# Patient Record
Sex: Female | Born: 1985 | Race: White | Hispanic: No | Marital: Married | Smoking: Never smoker
Health system: Southern US, Community
[De-identification: ages and names within clinical notes are randomized; demographics above are authoritative.]

## PROBLEM LIST (undated history)

## (undated) DIAGNOSIS — I639 Cerebral infarction, unspecified: Secondary | ICD-10-CM

## (undated) DIAGNOSIS — O24419 Gestational diabetes mellitus in pregnancy, unspecified control: Secondary | ICD-10-CM

## (undated) DIAGNOSIS — O9902 Anemia complicating childbirth: Secondary | ICD-10-CM

## (undated) DIAGNOSIS — G43909 Migraine, unspecified, not intractable, without status migrainosus: Secondary | ICD-10-CM

## (undated) DIAGNOSIS — O139 Gestational [pregnancy-induced] hypertension without significant proteinuria, unspecified trimester: Secondary | ICD-10-CM

## (undated) HISTORY — DX: Anemia complicating childbirth: O99.02

## (undated) HISTORY — DX: Gestational diabetes mellitus in pregnancy, unspecified control: O24.419

## (undated) HISTORY — DX: Gestational (pregnancy-induced) hypertension without significant proteinuria, unspecified trimester: O13.9

## (undated) HISTORY — DX: Migraine, unspecified, not intractable, without status migrainosus: G43.909

## (undated) HISTORY — PX: CARDIAC SURGERY: SHX584

---
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
## 2011-11-07 ENCOUNTER — Telehealth: Payer: Self-pay

## 2011-11-07 NOTE — Telephone Encounter (Signed)
PT IN NEED OF HER XYANSE. PLEASE CALL [phone]

## 2011-11-08 MED ORDER — LISDEXAMFETAMINE DIMESYLATE 70 MG PO CAPS: 70.0000 mg | ORAL_CAPSULE | ORAL | Status: DC

## 2011-11-08 NOTE — Telephone Encounter (Signed)
LMOM THAT RX IS READY FOR PICKUP 

## 2011-11-08 NOTE — Telephone Encounter (Signed)
vyvanse ready to p/up

## 2011-12-10 ENCOUNTER — Telehealth: Payer: Self-pay

## 2011-12-10 NOTE — Telephone Encounter (Signed)
PT REQUESTING VYVANSE  REFILL     BEST PHONE [phone]

## 2011-12-11 MED ORDER — LISDEXAMFETAMINE DIMESYLATE 70 MG PO CAPS: 70.0000 mg | ORAL_CAPSULE | ORAL | Status: DC

## 2011-12-11 NOTE — Telephone Encounter (Signed)
Chart is at nurses station for review MR [id number]

## 2011-12-11 NOTE — Telephone Encounter (Signed)
LMOM THAT RX IS READY FOR PICKUP 

## 2011-12-11 NOTE — Telephone Encounter (Signed)
Rx ready for pick up. 

## 2012-03-16 ENCOUNTER — Ambulatory Visit (INDEPENDENT_AMBULATORY_CARE_PROVIDER_SITE_OTHER): Payer: BC Managed Care – PPO | Admitting: Emergency Medicine

## 2012-03-16 VITALS — BP 108/74 | HR 61 | Temp 98.3°F | Resp 17 | Ht 63.0 in | Wt 143.0 lb

## 2012-03-16 DIAGNOSIS — F988 Other specified behavioral and emotional disorders with onset usually occurring in childhood and adolescence: Secondary | ICD-10-CM

## 2012-03-16 MED ORDER — LISDEXAMFETAMINE DIMESYLATE 70 MG PO CAPS: 70.0000 mg | ORAL_CAPSULE | ORAL | Status: DC

## 2012-03-16 NOTE — Progress Notes (Signed)
   Date:  03/16/2012   Name:  KIYO HEAL   DOB:  [birth year]   MRN:  [id number] Gender: female Age: 26 y.o.  PCP:  No primary provider on file.    Chief Complaint: Medication Refill   History of Present Illness:  Rhonda Tran is a 26 y.o. pleasant patient who presents with the following:  Well established and documented history of ADD for renewal of prescription for vivance.  Weight stable, eating.  Symptoms controlled on stable dose of medication.  Not interfering with sleep.   No acute complaints.  There is no problem list on file for this patient.   No past medical history on file.  No past surgical history on file.  History  Substance Use Topics  . Smoking status: Never Smoker   . Smokeless tobacco: Not on file  . Alcohol Use: Not on file    No family history on file.  No Known Allergies  Medication list has been reviewed and updated.  Current Outpatient Prescriptions on File Prior to Visit  Medication Sig Dispense Refill  . lisdexamfetamine (VYVANSE) 70 MG capsule Take 1 capsule (70 mg total) by mouth every morning.  30 capsule  0    Review of Systems:  As per HPI, otherwise negative.    Physical Examination: Filed Vitals:   03/16/12 0937  BP: 108/74  Pulse: 61  Temp: 98.3 F (36.8 C)  Resp: 17   Filed Vitals:   03/16/12 0937  Height: 5\' 3"  (1.6 m)  Weight: 143 lb (64.864 kg)   Body mass index is 25.33 kg/(m^2). Ideal Body Weight: Weight in (lb) to have BMI = 25: 140.8    GEN: WDWN, NAD, Non-toxic, Alert & Oriented x 3 HEENT: Atraumatic, Normocephalic.  Ears and Nose: No external deformity. EXTR: No clubbing/cyanosis/edema NEURO: Normal gait.  PSYCH: Normally interactive. Conversant. Not depressed or anxious appearing.  Calm demeanor.    Assessment and Plan: ADD Continue medication Follow up with Dr Margarita Grizzle, Tessa Lerner, MD I have reviewed and agree with documentation. Robert P. Merla Riches, M.D.

## 2012-04-15 NOTE — Progress Notes (Signed)
Completed prior auth over the phone for pt's Vyvanse Rx and received approval through 04/15/13. Faxed approval notice to pharmacy.

## 2012-04-30 ENCOUNTER — Ambulatory Visit (INDEPENDENT_AMBULATORY_CARE_PROVIDER_SITE_OTHER): Payer: BC Managed Care – PPO | Admitting: Physician Assistant

## 2012-04-30 VITALS — BP 122/94 | HR 89 | Temp 98.1°F | Resp 16 | Ht 62.25 in | Wt 145.6 lb

## 2012-04-30 DIAGNOSIS — R21 Rash and other nonspecific skin eruption: Secondary | ICD-10-CM

## 2012-04-30 DIAGNOSIS — Z23 Encounter for immunization: Secondary | ICD-10-CM

## 2012-04-30 MED ORDER — DOXYCYCLINE HYCLATE 100 MG PO CAPS: 100.0000 mg | ORAL_CAPSULE | Freq: Two times a day (BID) | ORAL | Status: DC

## 2012-04-30 NOTE — Progress Notes (Signed)
  Subjective:    Patient ID: Rhonda Tran, female    DOB: 04/13/1986, 26 y.o.   MRN: [id number]  HPI 26 year old female presents with acute onset of a rash on her pubic region.  States symptoms started 2 days ago as a small pimple that she tried to pop and did get a little bit of pus out. Since then they have become more painful. She has been putting Neosporin on the lesions which does not seem to be helping at all. Today they were irritating while she was sitting and so she decided to get it checked out.  Denies vaginal discharge, abdominal pain, nausea, vomiting, fever, or chills.  Admits that she has not been sexually active in 2 years, and had normal STD testing this summer.  Says this included a blood test for herpes - all negative.  She has no specific concerns for sexually transmitted infection.     Review of Systems  Constitutional: Negative for fever and chills.  Gastrointestinal: Negative for nausea, vomiting and abdominal pain.  Skin: Positive for rash.  All other systems reviewed and are negative.       Objective:   Physical Exam  Constitutional: She is oriented to person, place, and time. She appears well-developed and well-nourished.  HENT:  Head: Normocephalic and atraumatic.  Right Ear: External ear normal.  Left Ear: External ear normal.  Eyes: Conjunctivae normal are normal.  Neck: Normal range of motion.  Cardiovascular: Normal rate, regular rhythm and normal heart sounds.   Pulmonary/Chest: Effort normal and breath sounds normal.  Musculoskeletal: Normal range of motion.  Neurological: She is alert and oriented to person, place, and time.  Skin: Rash noted.     Psychiatric: She has a normal mood and affect. Her behavior is normal. Judgment and thought content normal.          Assessment & Plan:   1. Rash and nonspecific skin eruption  Herpes simplex virus culture, HSV(herpes simplex vrs) 1+2 ab-IgG, Wound culture  2. Need for influenza vaccination  Flu  vaccine greater than or equal to 3yo preservative free IM   Discussed folliculitis vs herpes infection.  I do think the diagnosis of herpes is unlikely but still need to rule out. Will await culture's.  Start doxycycline 100 mg bid.  Warm compresses.  D/C Neosporin.

## 2012-05-02 LAB — HERPES SIMPLEX VIRUS CULTURE: Organism ID, Bacteria: NOT DETECTED

## 2012-05-02 LAB — HSV(HERPES SIMPLEX VRS) I + II AB-IGG
HSV 1 Glycoprotein G Ab, IgG: 0.47 IV
HSV 2 Glycoprotein G Ab, IgG: <0.1 IV

## 2012-05-03 LAB — WOUND CULTURE
Gram Stain: NONE SEEN
Gram Stain: NONE SEEN

## 2012-06-12 ENCOUNTER — Telehealth: Payer: Self-pay

## 2012-06-12 NOTE — Telephone Encounter (Signed)
Pt would like to get her prescription for Vyvanse.  Please call when ready to pick up.  [phone]

## 2012-06-13 NOTE — Telephone Encounter (Signed)
No, she must come in to be seen, with the person that originally prescribed her vyvanse.

## 2012-06-14 ENCOUNTER — Encounter: Payer: Self-pay | Admitting: Internal Medicine

## 2012-06-14 ENCOUNTER — Ambulatory Visit (INDEPENDENT_AMBULATORY_CARE_PROVIDER_SITE_OTHER): Payer: BC Managed Care – PPO | Admitting: Internal Medicine

## 2012-06-14 ENCOUNTER — Telehealth: Payer: Self-pay

## 2012-06-14 VITALS — BP 112/74 | HR 72 | Temp 98.6°F | Resp 18 | Ht 63.5 in | Wt 149.0 lb

## 2012-06-14 DIAGNOSIS — Z9861 Coronary angioplasty status: Secondary | ICD-10-CM

## 2012-06-14 DIAGNOSIS — Z8774 Personal history of (corrected) congenital malformations of heart and circulatory system: Secondary | ICD-10-CM | POA: Insufficient documentation

## 2012-06-14 DIAGNOSIS — G43909 Migraine, unspecified, not intractable, without status migrainosus: Secondary | ICD-10-CM | POA: Insufficient documentation

## 2012-06-14 DIAGNOSIS — F988 Other specified behavioral and emotional disorders with onset usually occurring in childhood and adolescence: Secondary | ICD-10-CM | POA: Insufficient documentation

## 2012-06-14 HISTORY — DX: Migraine, unspecified, not intractable, without status migrainosus: G43.909

## 2012-06-14 MED ORDER — LISDEXAMFETAMINE DIMESYLATE 70 MG PO CAPS: 70.0000 mg | ORAL_CAPSULE | ORAL | Status: DC

## 2012-06-14 NOTE — Progress Notes (Signed)
  Subjective:    Patient ID: Rhonda Tran, female    DOB: Dec 05, 1985, 26 y.o.   MRN: [id number]  HPIhere for f/u Patient Active Problem List  Diagnosis  . 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 ADD (attention deficit disorder)-continues to do well on 70 mg vyvanse/no side effects  . Migraine headache-these headaches are mainly the first few days after each menses. They respond to ibuprofen.    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 . is doing very well Plans to get married in February/remains abstinent/to discuss contraception with OB at the upcoming appointment Nonsmoker Immunizations still up to date   Review of Systems No fatigue night sweats or weight loss No vision changes No tremor No palpitations chest pain GI and GU are negative    Objective:   Physical Exam Vital signs normal HEENT clear No thyromegaly or lymphadenopathy Heart regular without murmurs rubs clicks or gallops/S1 and S2 are normal Neurological intact Psychological intact       Assessment & Plan:   1. ADD (attention deficit disorder)  lisdexamfetamine (VYVANSE) 70 MG capsule, lisdexamfetamine (VYVANSE) 70 MG capsule,07/14/12 lisdexamfetamine (VYVANSE) 70 MG capsule 08/15/12   2. History of percutaneous transcatheter closure of congenital ASD   should need no further cardiology evaluation unless she develops  An atrial arrhythmia  3. Migraine headache   stable    She may call for another three-month prescription of medication for ADD and has a followup appointment at 104 in 6 months

## 2012-06-14 NOTE — Telephone Encounter (Signed)
Pt and patients mother called to see why patient has to come back in -which they state she has never had to do before and that she has an appt with dr Merla Rhonda Tran on 07/16/12 and she is out of her medication  vyvanse  Best number is [phone] mothers number

## 2012-06-14 NOTE — Telephone Encounter (Signed)
Patient is currently in the office being seen by Dr. Merla Riches. Will close this phone note.

## 2012-06-16 NOTE — Telephone Encounter (Signed)
Dr Merla Riches has seen her on Friday, and this is taken care of.

## 2012-06-18 NOTE — Progress Notes (Signed)
Cancelled 07/2012 appt and left msg for pt to schedule 6 month f/u with Dr. Merla Riches. Rhonda Tran

## 2012-06-20 NOTE — Progress Notes (Signed)
Six month appt made with Dr. Merla Riches for 12/11/11.

## 2012-07-15 NOTE — Progress Notes (Signed)
Called for prior auth on pt's vyvanse 70 and was told that it just needed to be transferred from another acct. PA was transferred and medication is approved through 04/15/2013. Faxed approval notice to pharmacy.

## 2012-07-16 ENCOUNTER — Ambulatory Visit: Payer: BC Managed Care – PPO | Admitting: Internal Medicine

## 2012-09-10 ENCOUNTER — Telehealth: Payer: Self-pay

## 2012-09-10 DIAGNOSIS — F988 Other specified behavioral and emotional disorders with onset usually occurring in childhood and adolescence: Secondary | ICD-10-CM

## 2012-09-10 NOTE — Telephone Encounter (Signed)
Pt requesting vyvanse refill,would like to be written for 90 day supply,she states if we forward to dr Merla Riches he will approve that amount  Best phone [phone]

## 2012-09-13 ENCOUNTER — Telehealth: Payer: Self-pay

## 2012-09-13 MED ORDER — LISDEXAMFETAMINE DIMESYLATE 70 MG PO CAPS: 70.0000 mg | ORAL_CAPSULE | ORAL | Status: DC

## 2012-09-13 NOTE — Telephone Encounter (Signed)
Please advise on Vyvanse renewal

## 2012-09-13 NOTE — Telephone Encounter (Signed)
Patient advised; Rx at front desk

## 2012-09-13 NOTE — Telephone Encounter (Signed)
Meds ordered this encounter  Medications  . lisdexamfetamine (VYVANSE) 70 MG capsule    Sig: Take 1 capsule (70 mg total) by mouth every morning. For after 11/13/12    Dispense:  30 capsule    Refill:  0  . lisdexamfetamine (VYVANSE) 70 MG capsule    Sig: Take 1 capsule (70 mg total) by mouth every morning. For after 10/14/12    Dispense:  30 capsule    Refill:  0  . lisdexamfetamine (VYVANSE) 70 MG capsule    Sig: Take 1 capsule (70 mg total) by mouth every morning.    Dispense:  30 capsule    Refill:  0

## 2012-09-13 NOTE — Telephone Encounter (Signed)
Patient states she called on Wednesday about her rx refill. She has not heard anything from [place] yet and she will be out of her rx tomorrow. Please call back at [phone]. Sees Dr. Merla Riches.

## 2012-09-15 NOTE — Telephone Encounter (Signed)
Yes, I called her on Saturday and she picked this up.

## 2012-09-15 NOTE — Telephone Encounter (Signed)
Did this get done

## 2012-10-23 ENCOUNTER — Encounter: Payer: Self-pay | Admitting: Cardiology

## 2012-10-23 NOTE — Progress Notes (Signed)
Patient ID: Rhonda Tran, female   DOB: Oct 17, 1985, 27 y.o.   MRN: [id number]   Rhonda, Tran    Date of visit:  10/23/2012 DOB:  [birth year]    Age:  26 yrs. Medical record number:  [id number]     Account number:  [id number] Primary Care Provider: Ellamae Sia Tran ____________________________ CURRENT DIAGNOSES  1. History of PFO closure with Amplatzer device  2. TIA ____________________________ ALLERGIES  NKDA ____________________________ MEDICATIONS  1. Vyvanse 70 mg capsule, 1 Tran.o. daily  2. Loestrin 24 Fe 1-20 (24)-75(4) mg-mcg-mg tablet, 1 Tran.o. daily  3. minocycline 100 mg tablet, BID ____________________________ CHIEF COMPLAINTS  Established with cardiologist ____________________________ HISTORY OF PRESENT ILLNESS  This very nice 27 year old female is seen for cardiac evaluation. She has previously been in good health but at age 59 had what was felt to be a left-sided TIA. She was eventually told that she had a PFO and had a positive Doppler study and bubble study. She was evaluated by Dr. Nadara Eaton in town and eventually a neurologist and was told that she had migraines but also had a positive intracranial Doppler. She eventually went to see Dr. Venetia Maxon in [place] and underwent closure of a PFO with a 60 mm Amplatzer device. She stated that she had a number of migraines in the early period after this but had done well and was seen in followuplast in 2008. She recently received a letter advising her to have followup because of FDA requirements and made an appointment to see me today. She is due to be married in June. She denies angina and has no exercise intolerance. She has felt fine since her PFO was closed. She has no arrhythmias and denies PND, orthopnea, syncope, or claudication. ____________________________ PAST HISTORY  Past Medical Illnesses:  history of TIA, migraine headaches, ADHD;  Cardiovascular Illnesses:  ASD;  Cardiology Procedures-Invasive:  closure of PFO with 60 mm  Amplatzer device, [facility] , [place]  06/2005;  Cardiology Procedures-Noninvasive:  echocardiogram January 2008 2007 2006;  LVEF of 72% documented via echocardiogram on 08/13/2006,   ____________________________ CARDIO-PULMONARY TEST DATES EKG Date:  10/23/2012;   ____________________________ FAMILY HISTORY Father - age 26,  alive and well; Mother - age 107,  alive and well;  ____________________________ SOCIAL HISTORY Alcohol Use:  no alcohol use;  Smoking:  never smoked;  Diet:  regular diet;  Lifestyle:  single;  Exercise:  curves 3 x week;  Occupation:  Runner, broadcasting/film/video;  Residence:  lives with mother;   ____________________________ REVIEW OF SYSTEMS General:  denies recent weight change, fatique or change in exercise tolerance. Eyes: denies diplopia, history of glaucoma or visual problems. Ears, Nose, Throat, Mouth:  denies any hearing loss, epistaxis, hoarseness or difficulty speaking. Respiratory: denies dyspnea, cough, wheezing or hemoptysis. Cardiovascular:  please review HPI Abdominal: denies dyspepsia, GI bleeding, constipation, or diarrhea Genitourinary-Female: no dysuria, urgency, frequency, or nocturia Genitourinary-Female: no dysuria, urgency, frequency, UTIs, or stress incontinence Musculoskeletal:  denies arthritis, venous insufficiency, or muscle weakness. Neurological:  occasional headaches  ____________________________ PHYSICAL EXAMINATION VITAL SIGNS  Blood Pressure:  104/70 Sitting, Right arm, regular cuff  , 100/68 Standing, Right arm and regular cuff   Pulse:  82/min. Weight:  142.00 lbs. Height:  63"BMI: 25  Constitutional:  cooperative, alert and oriented,well developed, well nourished, in no acute distress. Skin:  blotchy rash Head:  normocephalic, normal hair pattern, no masses or tenderness Eyes:  EOMS Intact, PERRLA, C and S clear, Funduscopic exam not done. ENT:  ears, nose and throat  reveal no gross abnormalities.  Dentition good. Neck:  supple, without massess. No JVD,  thyromegaly or carotid bruits. Carotid upstroke normal. Chest:  normal symmetry, clear to auscultation and percussion. Cardiac:  regular rhythm, normal S1 and S2, No S3 or S4, no murmurs, gallops or rubs detected. Abdomen:  abdomen soft,non-tender, no masses, no hepatospenomegaly, or aneurysm noted Peripheral Pulses:  the femoral,dorsalis pedis, and posterior tibial pulses are full and equal bilaterally with no bruits auscultated. Extremities & Back:  no deformities, clubbing, cyanosis, erythema or edema observed. Normal muscle strength and tone. Neurological:  no gross motor or sensory deficits noted, affect appropriate, oriented x3. ____________________________ IMPRESSIONS/PLAN  1. History of TIA 2. History of closure of PFO with 60 mm Amplatzer device in 2006  Recommendations:  Obtain followup echocardiogram and have yearly visit here with echo. EKG is normal.  ____________________________ TODAYS ORDERS  1. 2D, color flow, doppler: At [facility]  2. 12 Lead EKG: Today                      ____________________________ Cardiology Physician:  Darden Palmer MD [facility]

## 2012-12-10 ENCOUNTER — Ambulatory Visit (INDEPENDENT_AMBULATORY_CARE_PROVIDER_SITE_OTHER): Payer: BC Managed Care – PPO | Admitting: Internal Medicine

## 2012-12-10 ENCOUNTER — Encounter: Payer: Self-pay | Admitting: Internal Medicine

## 2012-12-10 VITALS — BP 115/76 | HR 104 | Temp 98.7°F | Resp 18 | Ht 63.0 in | Wt 143.0 lb

## 2012-12-10 DIAGNOSIS — F988 Other specified behavioral and emotional disorders with onset usually occurring in childhood and adolescence: Secondary | ICD-10-CM

## 2012-12-10 MED ORDER — LISDEXAMFETAMINE DIMESYLATE 70 MG PO CAPS: 70.0000 mg | ORAL_CAPSULE | ORAL | Status: DC

## 2012-12-10 NOTE — Progress Notes (Addendum)
  Subjective:    Patient ID: Rhonda Tran, female    DOB: 1986/05/24, 27 y.o.   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 Donnie Aho will see her yearly for 5 years  Objective:   Physical Exam BP 115/76  Pulse 104  Temp(Src) 98.7 F (37.1 C)  Resp 18  Ht 5\' 3"  (1.6 m)  Wt 143 lb (64.864 kg)  BMI 25.34 kg/m2 Exam intact       Assessment & Plan:  Attention deficit disorder  Meds ordered this encounter  Medications  . lisdexamfetamine (VYVANSE) 70 MG capsule    Sig: Take 1 capsule (70 mg total) by mouth every morning. 02/13/13    Dispense:  30 capsule    Refill:  0  . lisdexamfetamine (VYVANSE) 70 MG capsule    Sig: Take 1 capsule (70 mg total) by mouth every morning. For after 12/14/12    Dispense:  30 capsule    Refill:  0  . lisdexamfetamine (VYVANSE) 70 MG capsule    Sig: Take 1 capsule (70 mg total) by mouth every morning. For after 01/13/13    Dispense:  30 capsule    Refill:  0  Refill in 3 months in followup in 6 months

## 2013-02-17 ENCOUNTER — Encounter: Payer: Self-pay | Admitting: Internal Medicine

## 2013-02-25 ENCOUNTER — Ambulatory Visit (INDEPENDENT_AMBULATORY_CARE_PROVIDER_SITE_OTHER): Payer: BC Managed Care – PPO | Admitting: Physician Assistant

## 2013-02-25 VITALS — BP 102/66 | HR 89 | Temp 98.8°F | Resp 18 | Ht 63.0 in | Wt 147.0 lb

## 2013-02-25 DIAGNOSIS — M549 Dorsalgia, unspecified: Secondary | ICD-10-CM

## 2013-02-25 MED ORDER — CYCLOBENZAPRINE HCL 10 MG PO TABS: 10.0000 mg | ORAL_TABLET | Freq: Three times a day (TID) | ORAL | Status: DC | PRN

## 2013-02-25 MED ORDER — MELOXICAM 15 MG PO TABS: 15.0000 mg | ORAL_TABLET | Freq: Every day | ORAL | Status: DC

## 2013-02-25 NOTE — Progress Notes (Signed)
  Subjective:    Patient ID: Rhonda Tran, female    DOB: 1986-06-30, 27 y.o.   MRN: [id number]  HPI   Ms. Rhonda Tran is a very pleasant 27 yr old female here with 2 days of mid/low back pain.  Slipped and fell Monday night, think she hit her back.  Noticed pain the next morning, worsening since then.  "Whole back hurts" - esp center of low back.  No previous back problems.  No radiation of pain.  No numbness or tingling.  No loss of bowel or bladder control.  Rates pain as 7/10, did take 2 ibuprofen today.  Pain is not as bad in the AM, but worsens throughout the day.     Review of Systems  Constitutional: Negative.   HENT: Negative.   Respiratory: Negative.   Cardiovascular: Negative.   Gastrointestinal: Negative.   Musculoskeletal: Positive for back pain.  Skin: Negative.   Neurological: Negative.        Objective:   Physical Exam  Vitals reviewed. Constitutional: She is oriented to person, place, and time. She appears well-developed and well-nourished. No distress.  HENT:  Head: Normocephalic and atraumatic.  Eyes: Conjunctivae are normal. No scleral icterus.  Cardiovascular: Normal rate, regular rhythm and normal heart sounds.   Pulmonary/Chest: Effort normal and breath sounds normal.  Abdominal: Soft. There is no tenderness.  Musculoskeletal:       Cervical back: Normal.       Thoracic back: She exhibits pain. She exhibits normal range of motion, no tenderness, no bony tenderness and no spasm.       Lumbar back: She exhibits pain. She exhibits normal range of motion, no tenderness, no bony tenderness and no spasm.  Neurological: She is alert and oriented to person, place, and time. She has normal reflexes.  Skin: Skin is warm and dry.  Psychiatric: She has a normal mood and affect. Her behavior is normal.         Assessment & Plan:  Back pain - Plan: cyclobenzaprine (FLEXERIL) 10 MG tablet, meloxicam (MOBIC) 15 MG tablet   Ms. Rhonda Tran is a very pleasant 27 yr old female here  with 2 days of back pain after a fall.  Exam is reassuring - there is no bony tenderness and no TTP or palpable spasm.  Her ROM is normal though she has pain at the ends of her range.  Will try Mobic and Flexeril.  Encouraged gentle stretching and ROM exercises.  Discouraged her from remaining sedentary.  Edu materials given.  If pain worsening or not improving pt to RTC.

## 2013-02-25 NOTE — Patient Instructions (Addendum)
Begin taking the meloxicam (Mobic) once daily - this works on pain and inflammation.  Do not take any additional ibuprofen or aleve with this medication.  You can take tylenol if you need further pain relief.  Also begin using the cyclobenzaprine (Flexeril) every 8 hours as needed - this may make you sleepy so be careful with the first dose.  You can also cut this medication in half.  Continue these medications for 1-2 weeks.  Be careful with lifting and bending for the next 2-3 days, but do not remain completely sedentary - movement is medicine!  If your pain is worsening or persisting please come back in to be rechecked   Back Pain, Adult Low back pain is very common. About 1 in 5 people have back pain.The cause of low back pain is rarely dangerous. The pain often gets better over time.About half of people with a sudden onset of back pain feel better in just 2 weeks. About 8 in 10 people feel better by 6 weeks.  CAUSES Some common causes of back pain include:  Strain of the muscles or ligaments supporting the spine.  Wear and tear (degeneration) of the spinal discs.  Arthritis.  Direct injury to the back. DIAGNOSIS Most of the time, the direct cause of low back pain is not known.However, back pain can be treated effectively even when the exact cause of the pain is unknown.Answering your caregiver's questions about your overall health and symptoms is one of the most accurate ways to make sure the cause of your pain is not dangerous. If your caregiver needs more information, he or she may order lab work or imaging tests (X-rays or MRIs).However, even if imaging tests show changes in your back, this usually does not require surgery. HOME CARE INSTRUCTIONS For many people, back pain returns.Since low back pain is rarely dangerous, it is often a condition that people can learn to [facility] their own.   Remain active. It is stressful on the back to sit or stand in one place. Do not sit, drive,  or stand in one place for more than 30 minutes at a time. Take short walks on level surfaces as soon as pain allows.Try to increase the length of time you walk each day.  Do not stay in bed.Resting more than 1 or 2 days can delay your recovery.  Do not avoid exercise or work.Your body is made to move.It is not dangerous to be active, even though your back may hurt.Your back will likely heal faster if you return to being active before your pain is gone.  Pay attention to your body when you bend and lift. Many people have less discomfortwhen lifting if they bend their knees, keep the load close to their bodies,and avoid twisting. Often, the most comfortable positions are those that put less stress on your recovering back.  Find a comfortable position to sleep. Use a firm mattress and lie on your side with your knees slightly bent. If you lie on your back, put a pillow under your knees.  Only take over-the-counter or prescription medicines as directed by your caregiver. Over-the-counter medicines to reduce pain and inflammation are often the most helpful.Your caregiver may prescribe muscle relaxant drugs.These medicines help dull your pain so you can more quickly return to your normal activities and healthy exercise.  Put ice on the injured area.  Put ice in a plastic bag.  Place a towel between your skin and the bag.  Leave the ice on for  15-20 minutes, 3-4 times a day for the first 2 to 3 days. After that, ice and heat may be alternated to reduce pain and spasms.  Ask your caregiver about trying back exercises and gentle massage. This may be of some benefit.  Avoid feeling anxious or stressed.Stress increases muscle tension and can worsen back pain.It is important to recognize when you are anxious or stressed and learn ways to manage it.Exercise is a great option. SEEK MEDICAL CARE IF:  You have pain that is not relieved with rest or medicine.  You have pain that does not  improve in 1 week.  You have new symptoms.  You are generally not feeling well. SEEK IMMEDIATE MEDICAL CARE IF:   You have pain that radiates from your back into your legs.  You develop new bowel or bladder control problems.  You have unusual weakness or numbness in your arms or legs.  You develop nausea or vomiting.  You develop abdominal pain.  You feel faint. Document Released: 06/25/2005 Document Revised: 12/25/2011 Document Reviewed: 11/13/2010 [facility] Patient Information 2014 [place], [place].

## 2013-03-09 ENCOUNTER — Telehealth: Payer: Self-pay

## 2013-03-09 DIAGNOSIS — F988 Other specified behavioral and emotional disorders with onset usually occurring in childhood and adolescence: Secondary | ICD-10-CM

## 2013-03-09 MED ORDER — LISDEXAMFETAMINE DIMESYLATE 70 MG PO CAPS: 70.0000 mg | ORAL_CAPSULE | ORAL | Status: DC

## 2013-03-09 NOTE — Telephone Encounter (Signed)
Meds ordered this encounter  Medications  . lisdexamfetamine (VYVANSE) 70 MG capsule    Sig: Take 1 capsule (70 mg total) by mouth every morning.    Dispense:  30 capsule    Refill:  0    Fill on or after 03/16/13  . lisdexamfetamine (VYVANSE) 70 MG capsule    Sig: Take 1 capsule (70 mg total) by mouth every morning.    Dispense:  30 capsule    Refill:  0    For on or after 04/15/13  . lisdexamfetamine (VYVANSE) 70 MG capsule    Sig: Take 1 capsule (70 mg total) by mouth every morning.    Dispense:  30 capsule    Refill:  0    For on or after 05/16/13

## 2013-03-09 NOTE — Telephone Encounter (Signed)
Patient called requesting Dr. Merla Riches to prescribe more Vyzanse. Patient stated that her refills are out. Patient's phone # [phone]. Patient has requested to be called back.

## 2013-03-09 NOTE — Telephone Encounter (Signed)
Pended please advise.  

## 2013-03-09 NOTE — Telephone Encounter (Signed)
called her to advise

## 2013-03-10 ENCOUNTER — Telehealth: Payer: Self-pay

## 2013-03-10 DIAGNOSIS — F988 Other specified behavioral and emotional disorders with onset usually occurring in childhood and adolescence: Secondary | ICD-10-CM

## 2013-03-10 NOTE — Telephone Encounter (Signed)
PATIENT STATES DR. Merla Riches DID NOT GIVE HER ENOUGH VYVANSE PILLS FOR THE MONTH OF AUGUST. SHE WAS 2 PILLS SHORT SINCE THERE ARE 31 DAYS IN THE MONTH. PLEASE CALL HER WHEN THE PRESCRIPTION CAN BE PICKED UP. BEST PHONE [phone] (CELL)  MBC

## 2013-03-10 NOTE — Telephone Encounter (Signed)
Please advise 

## 2013-03-11 NOTE — Telephone Encounter (Signed)
Has rx for 9/8, 10/8,11/8 what do we need to do ?

## 2013-03-12 ENCOUNTER — Other Ambulatory Visit: Payer: Self-pay | Admitting: Radiology

## 2013-03-12 ENCOUNTER — Telehealth: Payer: Self-pay

## 2013-03-12 DIAGNOSIS — F988 Other specified behavioral and emotional disorders with onset usually occurring in childhood and adolescence: Secondary | ICD-10-CM

## 2013-03-12 MED ORDER — LISDEXAMFETAMINE DIMESYLATE 70 MG PO CAPS: 70.0000 mg | ORAL_CAPSULE | ORAL | Status: DC

## 2013-03-12 NOTE — Telephone Encounter (Signed)
Patient's mother is calling to check the status of daughter's RX. States that daughter is going to run out of Vyvanse on Saturday 03/14/2013. Wants to know if she can get another supply before the 8th. [phone]

## 2013-03-12 NOTE — Telephone Encounter (Signed)
Sure --can fill early or can write new rx what works best??? I'll just write new rx now and we can leave fore her

## 2013-03-12 NOTE — Telephone Encounter (Signed)
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

## 2013-03-12 NOTE — Telephone Encounter (Signed)
Please advise. Do you want to write for 2 pills? Pended, this is what she wants.

## 2013-03-12 NOTE — Telephone Encounter (Signed)
She had her mother call me , she will come in and exchange the rx

## 2013-03-12 NOTE — Telephone Encounter (Signed)
She will have Baxter Hire come in and exchange the Rx

## 2013-03-12 NOTE — Telephone Encounter (Signed)
I have placed the other Rx's in your box. She is concerned about the one for October as well. Wants #31 for this, pended

## 2013-03-12 NOTE — Telephone Encounter (Signed)
Pended please advise.  

## 2013-03-12 NOTE — Telephone Encounter (Signed)
Called about this, left message for her to call me back. She will need to exchange the old rx for the new one.

## 2013-03-13 ENCOUNTER — Other Ambulatory Visit: Payer: Self-pay | Admitting: Radiology

## 2013-03-13 DIAGNOSIS — F988 Other specified behavioral and emotional disorders with onset usually occurring in childhood and adolescence: Secondary | ICD-10-CM

## 2013-03-13 MED ORDER — LISDEXAMFETAMINE DIMESYLATE 70 MG PO CAPS: 70.0000 mg | ORAL_CAPSULE | ORAL | Status: DC

## 2013-03-13 NOTE — Telephone Encounter (Signed)
Reprinted for quantity of #31 old rx's for OCT and NOV shredded.

## 2013-04-15 NOTE — Progress Notes (Signed)
PA approved for vyvanse 70 mg through 04/15/14. Pt notified.

## 2013-06-10 ENCOUNTER — Ambulatory Visit (INDEPENDENT_AMBULATORY_CARE_PROVIDER_SITE_OTHER): Payer: BC Managed Care – PPO | Admitting: Internal Medicine

## 2013-06-10 ENCOUNTER — Encounter: Payer: Self-pay | Admitting: Internal Medicine

## 2013-06-10 VITALS — BP 98/78 | HR 88 | Temp 98.8°F | Resp 16 | Ht 62.5 in | Wt 152.2 lb

## 2013-06-10 DIAGNOSIS — F988 Other specified behavioral and emotional disorders with onset usually occurring in childhood and adolescence: Secondary | ICD-10-CM

## 2013-06-10 MED ORDER — LISDEXAMFETAMINE DIMESYLATE 70 MG PO CAPS: 70.0000 mg | ORAL_CAPSULE | ORAL | Status: DC

## 2013-06-10 NOTE — Progress Notes (Signed)
   Subjective:    Patient ID: Rhonda Tran, female    DOB: 1985-10-04, 27 y.o.   MRN: [id number]  HPI Patient presents for follow up of ADD. Is doing well on current dose of Vyvanse 70 mg po qd. Got married this summer and is happy. 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  No side effects of medication. No palpitations, no chest pain, no insomnia.  Married las sum and doing well  Review of Systems neg    Objective:   Physical Exam  Nursing note and vitals reviewed. Constitutional: She appears well-developed and well-nourished.  Pulmonary/Chest: Effort normal.  Skin: Skin is warm and dry.  Psychiatric: She has a normal mood and affect. Her behavior is normal. Judgment and thought content normal.      Assessment & Plan:  ADD (attention deficit disorder)  Meds ordered this encounter  Medications  . lisdexamfetamine (VYVANSE) 70 MG capsule    Sig: Take 1 capsule (70 mg total) by mouth every morning.    Dispense:  31 capsule    Refill:  0  . lisdexamfetamine (VYVANSE) 70 MG capsule    Sig: Take 1 capsule (70 mg total) by mouth every morning.    Dispense:  31 capsule    Refill:  0    For 31 d after sign date  . lisdexamfetamine (VYVANSE) 70 MG capsule    Sig: Take 1 capsule (70 mg total) by mouth every morning.    Dispense:  31 capsule    Refill:  0    For 61 days after sign date

## 2013-07-17 ENCOUNTER — Ambulatory Visit (INDEPENDENT_AMBULATORY_CARE_PROVIDER_SITE_OTHER): Payer: BC Managed Care – PPO | Admitting: Physician Assistant

## 2013-07-17 VITALS — BP 118/68 | HR 79 | Temp 98.3°F | Resp 18 | Ht 63.0 in | Wt 154.0 lb

## 2013-07-17 DIAGNOSIS — R0981 Nasal congestion: Secondary | ICD-10-CM

## 2013-07-17 DIAGNOSIS — J3489 Other specified disorders of nose and nasal sinuses: Secondary | ICD-10-CM

## 2013-07-17 DIAGNOSIS — J019 Acute sinusitis, unspecified: Secondary | ICD-10-CM

## 2013-07-17 MED ORDER — IPRATROPIUM BROMIDE 0.03 % NA SOLN: 2.0000 | Freq: Two times a day (BID) | NASAL | Status: DC

## 2013-07-17 MED ORDER — AZITHROMYCIN 250 MG PO TABS: ORAL_TABLET | ORAL | Status: DC

## 2013-07-17 NOTE — Progress Notes (Signed)
   Subjective:    Patient ID: Blenda MountsKristin D Preece, female    DOB: 10/23/1985, 28 y.o.   MRN: [id number]  HPI  28 year old female presents for evaluation of 3 week history of nasal congestion, sinus pressure, PND, slight cough, and bilateral ear fullness/pressure.  Symptoms have been progressively worsening despite treatment on 12/25 with augmentin 875 mg x 10 days. Continues to have clear rhinorrhea, sinus pressure, and sinus headache.  Denies fever, chills, nausea, vomiting, chest pain, SOB, wheezing, sore throat, or otalgia.  Hx of allergies when she was a child.  Patient is otherwise healthy with no other concerns today.     Review of Systems  Constitutional: Negative for fever and chills.  HENT: Positive for congestion, postnasal drip, rhinorrhea and sinus pressure. Negative for ear pain (bilateral ear pressure) and sore throat.   Respiratory: Positive for cough. Negative for chest tightness, shortness of breath and wheezing.   Cardiovascular: Negative for chest pain.  Gastrointestinal: Negative for nausea, vomiting and abdominal pain.  Neurological: Positive for dizziness and headaches (sinus).       Objective:   Physical Exam  Constitutional: She is oriented to person, place, and time. She appears well-developed and well-nourished.  HENT:  Head: Normocephalic and atraumatic.  Right Ear: Hearing, tympanic membrane, external ear and ear canal normal.  Left Ear: Hearing, tympanic membrane, external ear and ear canal normal.  Nose: Right sinus exhibits no maxillary sinus tenderness and no frontal sinus tenderness. Left sinus exhibits no maxillary sinus tenderness and no frontal sinus tenderness.  Mouth/Throat: Uvula is midline, oropharynx is clear and moist and mucous membranes are normal. No oropharyngeal exudate.  Eyes: Conjunctivae are normal.  Neck: Normal range of motion. Neck supple.  Cardiovascular: Normal rate, regular rhythm and normal heart sounds.   Pulmonary/Chest: Effort  normal and breath sounds normal.  Neurological: She is alert and oriented to person, place, and time.  Psychiatric: She has a normal mood and affect. Her behavior is normal. Judgment and thought content normal.          Assessment & Plan:  Sinusitis, acute - Plan: azithromycin (ZITHROMAX) 250 MG tablet  Nasal congestion - Plan: ipratropium (ATROVENT) 0.03 % nasal spray  Unlikely sinus infection since no improvement with Augmentin.  Will try Zpack but stressed importance of use of Atrovent NS twice daily and start Zyrtec.  Likely environmental allergy component to this. Ok to rx medrol dose pack if symptoms not improving.

## 2013-08-26 ENCOUNTER — Other Ambulatory Visit: Payer: Self-pay | Admitting: Internal Medicine

## 2013-08-26 DIAGNOSIS — F988 Other specified behavioral and emotional disorders with onset usually occurring in childhood and adolescence: Secondary | ICD-10-CM

## 2013-08-28 MED ORDER — LISDEXAMFETAMINE DIMESYLATE 70 MG PO CAPS: 70.0000 mg | ORAL_CAPSULE | ORAL | Status: DC

## 2013-08-28 NOTE — Telephone Encounter (Signed)
Looks like not due til 3/1///printed accordingly

## 2013-08-28 NOTE — Telephone Encounter (Signed)
Meds ordered this encounter  Medications  . lisdexamfetamine (VYVANSE) 70 MG capsule    Sig: Take 1 capsule (70 mg total) by mouth every morning. For 09/06/13    Dispense:  31 capsule    Refill:  0  . lisdexamfetamine (VYVANSE) 70 MG capsule    Sig: Take 1 capsule (70 mg total) by mouth every morning.    Dispense:  31 capsule    Refill:  0    For 30 d after 09/06/13  . lisdexamfetamine (VYVANSE) 70 MG capsule    Sig: Take 1 capsule (70 mg total) by mouth every morning.    Dispense:  31 capsule    Refill:  0    For 61 days after 09/06/13

## 2013-08-29 ENCOUNTER — Telehealth: Payer: Self-pay | Admitting: Radiology

## 2013-08-29 NOTE — Telephone Encounter (Signed)
Called patient about script being ready 

## 2013-12-09 ENCOUNTER — Encounter: Payer: Self-pay | Admitting: Internal Medicine

## 2013-12-09 ENCOUNTER — Ambulatory Visit (INDEPENDENT_AMBULATORY_CARE_PROVIDER_SITE_OTHER): Payer: BC Managed Care – PPO | Admitting: Internal Medicine

## 2013-12-09 VITALS — BP 110/74 | HR 92 | Temp 98.2°F | Resp 16 | Ht 62.5 in | Wt 156.6 lb

## 2013-12-09 DIAGNOSIS — F988 Other specified behavioral and emotional disorders with onset usually occurring in childhood and adolescence: Secondary | ICD-10-CM

## 2013-12-09 DIAGNOSIS — R059 Cough, unspecified: Secondary | ICD-10-CM

## 2013-12-09 DIAGNOSIS — R05 Cough: Secondary | ICD-10-CM

## 2013-12-09 DIAGNOSIS — J301 Allergic rhinitis due to pollen: Secondary | ICD-10-CM

## 2013-12-09 MED ORDER — LISDEXAMFETAMINE DIMESYLATE 70 MG PO CAPS: 70.0000 mg | ORAL_CAPSULE | ORAL | Status: DC

## 2013-12-09 MED ORDER — FLUTICASONE PROPIONATE 50 MCG/ACT NA SUSP: NASAL | Status: DC

## 2013-12-09 MED ORDER — PREDNISONE 20 MG PO TABS: ORAL_TABLET | ORAL | Status: DC

## 2013-12-09 NOTE — Progress Notes (Signed)
   Subjective:    Patient ID: Rhonda Tran, female    DOB: 1986-05-05, 28 y.o.   MRN: [id number]  HPI Chief Complaint  Patient presents with  . Follow-up    ADD  . Cough    since Nov. 2014, more so in the afternoon//treated for bronchitis 12/14 then came here 1/15-flonase//cough persists -clear mucus/no sob or doe/no reflux   . Medication Refill    Vyvanse 70 mg--- doing very well on medication without side effects    Married now for one year New classroom for 6 months New house since January  Review of Systems No fever chills or night sweats No weight loss No vision changes   no headaches No chest pain or palpitations  Objective:   Physical Exam BP 110/74  Pulse 92  Temp(Src) 98.2 F (36.8 C) (Oral)  Resp 16  Ht 5' 2.5" (1.588 m)  Wt 156 lb 9.6 oz (71.033 kg)  BMI 28.17 kg/m2  SpO2 100%  LMP 11/18/2013 Conjunctiva slightly injected Pupils equal round reactive to light and accommodation Nares boggy with scant mucus TMs clear/oral pharynx clear/no anterior cervical nodes No thyromegaly Chest clear to auscultation Heart regular without murmur      Assessment & Plan:  ADD (attention deficit disorder) - Plan: lisdexamfetamine (VYVANSE) 70 MG capsule, lisdexamfetamine (VYVANSE) 70 MG capsule, lisdexamfetamine (VYVANSE) 70 MG capsule  Recurrent cough secondary to allergic rhinitis  Has restarted Flonase for one week/use prednisone 6 days/continue Flonase and add zyrtec if needed Meds ordered this encounter  Medications  . predniSONE (DELTASONE) 20 MG tablet    Sig: 3/3/2/2/1/1 single daily dose for 6 days    Dispense:  12 tablet    Refill:  0  . lisdexamfetamine (VYVANSE) 70 MG capsule    Sig: Take 1 capsule (70 mg total) by mouth every morning.    Dispense:  31 capsule    Refill:  0    For 61 days after 01/08/14  . lisdexamfetamine (VYVANSE) 70 MG capsule    Sig: Take 1 capsule (70 mg total) by mouth every morning.    Dispense:  31 capsule    Refill:  0     For 30 d after 12/09/13  . lisdexamfetamine (VYVANSE) 70 MG capsule    Sig: Take 1 capsule (70 mg total) by mouth every morning.    Dispense:  31 capsule    Refill:  0  . fluticasone (FLONASE) 50 MCG/ACT nasal spray    Sig: 2spr qd    Dispense:  16 g    Refill:  6   followup 3-6 months

## 2014-03-13 ENCOUNTER — Telehealth: Payer: Self-pay | Admitting: Internal Medicine

## 2014-03-13 DIAGNOSIS — F988 Other specified behavioral and emotional disorders with onset usually occurring in childhood and adolescence: Secondary | ICD-10-CM

## 2014-03-16 MED ORDER — LISDEXAMFETAMINE DIMESYLATE 70 MG PO CAPS: 70.0000 mg | ORAL_CAPSULE | ORAL | Status: DC

## 2014-03-17 NOTE — Telephone Encounter (Signed)
Notified pt on VM and also MyChart.

## 2014-04-07 ENCOUNTER — Telehealth: Payer: Self-pay

## 2014-04-07 NOTE — Telephone Encounter (Signed)
PA needed for Vyvanse. Completed on phone and received approval from 03/17/14 - 04/07/15, case # [id number]. Notified pt on VM.

## 2014-04-23 ENCOUNTER — Other Ambulatory Visit: Payer: Self-pay

## 2014-05-28 ENCOUNTER — Telehealth: Payer: Self-pay

## 2014-05-28 NOTE — Telephone Encounter (Signed)
Rhonda Tran from [employer] left voicemail wanting to make sure we received a request for records. Patient is applying for life insurance. Cb# [phone].

## 2014-06-09 ENCOUNTER — Ambulatory Visit (INDEPENDENT_AMBULATORY_CARE_PROVIDER_SITE_OTHER): Payer: BC Managed Care – PPO | Admitting: Internal Medicine

## 2014-06-09 ENCOUNTER — Encounter: Payer: Self-pay | Admitting: Internal Medicine

## 2014-06-09 VITALS — BP 114/76 | HR 92 | Temp 98.0°F | Resp 16 | Ht 63.0 in | Wt 157.0 lb

## 2014-06-09 DIAGNOSIS — Z23 Encounter for immunization: Secondary | ICD-10-CM

## 2014-06-09 DIAGNOSIS — F988 Other specified behavioral and emotional disorders with onset usually occurring in childhood and adolescence: Secondary | ICD-10-CM

## 2014-06-09 DIAGNOSIS — F909 Attention-deficit hyperactivity disorder, unspecified type: Secondary | ICD-10-CM

## 2014-06-09 MED ORDER — LISDEXAMFETAMINE DIMESYLATE 70 MG PO CAPS: 70.0000 mg | ORAL_CAPSULE | ORAL | Status: DC

## 2014-06-09 MED ORDER — LISDEXAMFETAMINE DIMESYLATE 70 MG PO CAPS: 70.0000 mg | ORAL_CAPSULE | Freq: Every day | ORAL | Status: DC

## 2014-06-11 NOTE — Progress Notes (Signed)
Here for follow-up Patient Active Problem List   Diagnosis Date Noted  . History of percutaneous transcatheter closure of congenital ASD 06/14/2012  . ADD (attention deficit disorder) 06/14/2012  . 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 New house and new marriage both going well Medicines working very well Having no headaches  Review of systems is negative  Exam BP 114/76 mmHg  Pulse 92  Temp(Src) 98 F (36.7 C)  Resp 16  Ht 5\' 3"  (1.6 m)  Wt 157 lb (71.215 kg)  BMI 27.82 kg/m2  SpO2 100%  LMP 06/02/2014 No other problems identified  Need for prophylactic vaccination and inoculation against influenza - Plan: Flu Vaccine QUAD 36+ mos IM  ADD (attention deficit disorder) - Plan: lisdexamfetamine (VYVANSE) 70 MG capsule, lisdexamfetamine (VYVANSE) 70 MG capsule, lisdexamfetamine (VYVANSE) 70 MG capsule  Meds ordered this encounter  Medications  . lisdexamfetamine (VYVANSE) 70 MG capsule    Sig: Take 1 capsule (70 mg total) by mouth every morning. For 60d after signed    Dispense:  30 capsule    Refill:  0  . lisdexamfetamine (VYVANSE) 70 MG capsule    Sig: Take 1 capsule (70 mg total) by mouth every morning. For 30d after signed    Dispense:  30 capsule    Refill:  0  . lisdexamfetamine (VYVANSE) 70 MG capsule    Sig: Take 1 capsule (70 mg total) by mouth every morning.    Dispense:  30 capsule    Refill:  0  . lisdexamfetamine (VYVANSE) 70 MG capsule------- this was written to see if they would give her a 90 day supply instead of her 3:30 day supplies     Sig: Take 1 capsule (70 mg total) by mouth daily. This represents a 90 day supply    Dispense:  90 capsule    Refill:  0   Call in 3 months and follow-up in 6 months

## 2014-09-14 ENCOUNTER — Telehealth: Payer: Self-pay | Admitting: Internal Medicine

## 2014-09-14 DIAGNOSIS — F988 Other specified behavioral and emotional disorders with onset usually occurring in childhood and adolescence: Secondary | ICD-10-CM

## 2014-09-15 MED ORDER — LISDEXAMFETAMINE DIMESYLATE 70 MG PO CAPS: 70.0000 mg | ORAL_CAPSULE | ORAL | Status: DC

## 2014-09-16 NOTE — Telephone Encounter (Signed)
Called pt to let her know Rx's were ready to pick up. Left VM and sent mychart message.

## 2014-12-04 ENCOUNTER — Ambulatory Visit (INDEPENDENT_AMBULATORY_CARE_PROVIDER_SITE_OTHER): Payer: BC Managed Care – PPO | Admitting: Family Medicine

## 2014-12-04 VITALS — BP 122/74 | HR 103 | Temp 98.7°F | Ht 62.7 in | Wt 162.6 lb

## 2014-12-04 DIAGNOSIS — W57XXXA Bitten or stung by nonvenomous insect and other nonvenomous arthropods, initial encounter: Secondary | ICD-10-CM | POA: Diagnosis not present

## 2014-12-04 DIAGNOSIS — L0291 Cutaneous abscess, unspecified: Secondary | ICD-10-CM | POA: Diagnosis not present

## 2014-12-04 DIAGNOSIS — L0390 Cellulitis, unspecified: Secondary | ICD-10-CM | POA: Diagnosis not present

## 2014-12-04 MED ORDER — DOXYCYCLINE HYCLATE 100 MG PO TABS: 100.0000 mg | ORAL_TABLET | Freq: Two times a day (BID) | ORAL | Status: DC

## 2014-12-04 NOTE — Patient Instructions (Signed)
Continue using the topical hydrocortisone cream but I would discontinue using the Neosporin  Take doxycycline 1 twice daily for 1 week  Return if worsening or coming to a head that might need to be drained

## 2014-12-04 NOTE — Progress Notes (Signed)
  Subjective:  Patient ID: Rhonda Tran, female    DOB: 04/05/1986  Age: 29 y.o. MRN: [id number]  [age] year old lady who has multiple knots her right lower extremity from presumed mosquito bites. She has a history of bites persisting. However the medial aspect of her right calf has a couple of contiguous places that have persisted and had discomfort and pain. They're not itching. She is concerned they are probably infected. She's been putting on some topical hydrocortisone cream and Neosporin. When she had the day of being on her feet her places on her right leg were hurting her and more red.   Objective:   She does have a couple of old bites of the lateral aspect of her right leg. Medially or the above-mentioned places this have about 3 cm each, right adjacent to each other, of firmness in the subcutaneous tissue. There is no area fluctuance or to find erythema it looks like he might be coming to it.  Assessment & Plan:   Assessment: Insect bite reactions, possibly infected slowly resolving  Plan: Patient Instructions  Continue using the topical hydrocortisone cream but I would discontinue using the Neosporin  Take doxycycline 1 twice daily for 1 week  Return if worsening or coming to a head that might need to be drained     Anasha Perfecto, MD 12/04/2014

## 2014-12-15 ENCOUNTER — Encounter: Payer: Self-pay | Admitting: Internal Medicine

## 2014-12-15 ENCOUNTER — Ambulatory Visit (INDEPENDENT_AMBULATORY_CARE_PROVIDER_SITE_OTHER): Payer: BC Managed Care – PPO | Admitting: Internal Medicine

## 2014-12-15 VITALS — BP 110/90 | HR 92 | Temp 98.1°F | Resp 16 | Ht 63.25 in | Wt 163.4 lb

## 2014-12-15 DIAGNOSIS — W57XXXA Bitten or stung by nonvenomous insect and other nonvenomous arthropods, initial encounter: Secondary | ICD-10-CM

## 2014-12-15 DIAGNOSIS — F909 Attention-deficit hyperactivity disorder, unspecified type: Secondary | ICD-10-CM | POA: Diagnosis not present

## 2014-12-15 DIAGNOSIS — L0291 Cutaneous abscess, unspecified: Secondary | ICD-10-CM

## 2014-12-15 DIAGNOSIS — L0390 Cellulitis, unspecified: Secondary | ICD-10-CM | POA: Diagnosis not present

## 2014-12-15 DIAGNOSIS — F988 Other specified behavioral and emotional disorders with onset usually occurring in childhood and adolescence: Secondary | ICD-10-CM

## 2014-12-15 MED ORDER — CLINDAMYCIN HCL 300 MG PO CAPS: 300.0000 mg | ORAL_CAPSULE | Freq: Four times a day (QID) | ORAL | Status: DC

## 2014-12-15 MED ORDER — LISDEXAMFETAMINE DIMESYLATE 70 MG PO CAPS: 70.0000 mg | ORAL_CAPSULE | ORAL | Status: DC

## 2014-12-15 MED ORDER — TRIAMCINOLONE ACETONIDE 0.1 % EX CREA: 1.0000 "application " | TOPICAL_CREAM | Freq: Two times a day (BID) | CUTANEOUS | Status: DC

## 2014-12-16 NOTE — Progress Notes (Signed)
   Subjective:    Patient ID: Rhonda Tran, female    DOB: 1985-12-18, 29 y.o.   MRN: [id number]  HPI  Chief Complaint  Patient presents with  . 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 Follow-up    right lower leg, mosquito bite on 12/04/14, seen Dr. Alwyn Ren No resp to 1 week doxy Still red/swollen,sl tend//has never itched Bite etio never estab but hx of these papules for 2-3 yrs only on legs and related to outdoor backyards in 2 diff residences--always red pap eruption without feeling bite or having pruritis usu 3-5 paps in groups This current lesion larger longer lasting       Review of Systems  Constitutional: Negative for fever, activity change, appetite change, fatigue and unexpected weight change.  Eyes: Negative for visual disturbance.  Respiratory: Negative for shortness of breath.   Cardiovascular: Negative for chest pain and palpitations.  Neurological: Negative for headaches.  Psychiatric/Behavioral: Negative for sleep disturbance.   noncontr    Objective:   Physical Exam BP 110/90 mmHg  Pulse 92  Temp(Src) 98.1 F (36.7 C) (Oral)  Resp 16  Ht 5' 3.25" (1.607 m)  Wt 163 lb 6.4 oz (74.118 kg)  BMI 28.70 kg/m2  SpO2 100%  LMP 11/13/2014 HEENT-clear Ht reg Extr clear x papules,red both lower extr with larger area in question 3cmx4cm R med calf--tender but not fluctuant red/pink w/out vesicles/scabs/tracts 2 small paps in same vicinity No adenop  Neuro intact Psych-intact       Assessment & Plan:  ADD (attention deficit disorder) - Plan: lisdexamfetamine (VYVANSE) 70 MG capsule, lisdexamfetamine (VYVANSE) 70 MG capsule, lisdexamfetamine (VYVANSE) 70 MG capsule  Insect bites--? Type---no pruritis Cellulitis but no abscess, little change doxy  Unclear what this is--hx suggests bites confined to lower extremities--never above knees///slow resolution//after outdoors///no underlying  illness//no resid scarring  Plan--will use 10d clinda 300qid with topical triam 0.1% and hot compr tid If not resolved 2 weeks--to derm for eval  Meds ordered this encounter  Medications  . triamcinolone cream (KENALOG) 0.1 %    Sig: Apply 1 application topically 2 (two) times daily.    Dispense:  30 g    Refill:  0  . clindamycin (CLEOCIN) 300 MG capsule    Sig: Take 1 capsule (300 mg total) by mouth 4 (four) times daily.    Dispense:  40 capsule    Refill:  0  . lisdexamfetamine (VYVANSE) 70 MG capsule    Sig: Take 1 capsule (70 mg total) by mouth every morning. For 60d after signed    Dispense:  30 capsule    Refill:  0  . lisdexamfetamine (VYVANSE) 70 MG capsule    Sig: Take 1 capsule (70 mg total) by mouth every morning. For 30d after signed    Dispense:  30 capsule    Refill:  0  . lisdexamfetamine (VYVANSE) 70 MG capsule    Sig: Take 1 capsule (70 mg total) by mouth every morning.    Dispense:  30 capsule    Refill:  0

## 2015-01-20 ENCOUNTER — Ambulatory Visit (INDEPENDENT_AMBULATORY_CARE_PROVIDER_SITE_OTHER): Payer: BC Managed Care – PPO | Admitting: Family Medicine

## 2015-01-20 VITALS — BP 118/70 | HR 92 | Temp 98.6°F | Resp 16 | Ht 63.5 in | Wt 162.0 lb

## 2015-01-20 DIAGNOSIS — B373 Candidiasis of vulva and vagina: Secondary | ICD-10-CM

## 2015-01-20 DIAGNOSIS — R350 Frequency of micturition: Secondary | ICD-10-CM

## 2015-01-20 DIAGNOSIS — R1013 Epigastric pain: Secondary | ICD-10-CM | POA: Diagnosis not present

## 2015-01-20 DIAGNOSIS — B3731 Acute candidiasis of vulva and vagina: Secondary | ICD-10-CM

## 2015-01-20 LAB — POCT CBC
Granulocyte percent: 76 %G (ref 37–80)
HCT, POC: 39.3 % (ref 37.7–47.9)
Hemoglobin: 13.2 g/dL (ref 12.2–16.2)
LYMPH, POC: 1.5 (ref 0.6–3.4)
MCH: 27 pg (ref 27–31.2)
MCHC: 33.7 g/dL (ref 31.8–35.4)
MCV: 80.2 fL (ref 80–97)
MID (cbc): 0.3 (ref 0–0.9)
MPV: 6.7 fL (ref 0–99.8)
PLATELET COUNT, POC: 320 10*3/uL (ref 142–424)
POC Granulocyte: 5.7 (ref 2–6.9)
POC LYMPH PERCENT: 20.1 %L (ref 10–50)
POC MID %: 3.9 %M (ref 0–12)
RBC: 4.9 M/uL (ref 4.04–5.48)
RDW, POC: 12.8 %
WBC: 7.5 10*3/uL (ref 4.6–10.2)

## 2015-01-20 LAB — POCT WET PREP WITH KOH
Clue Cells Wet Prep HPF POC: NEGATIVE
KOH Prep POC: POSITIVE
RBC Wet Prep HPF POC: NEGATIVE
TRICHOMONAS UA: NEGATIVE

## 2015-01-20 LAB — POCT URINALYSIS DIPSTICK
Bilirubin, UA: NEGATIVE
Glucose, UA: NEGATIVE
Ketones, UA: NEGATIVE
Nitrite, UA: NEGATIVE
PH UA: 6
PROTEIN UA: NEGATIVE
Spec Grav, UA: 1.01
Urobilinogen, UA: 0.2

## 2015-01-20 LAB — POCT UA - MICROSCOPIC ONLY
CASTS, UR, LPF, POC: NEGATIVE
Crystals, Ur, HPF, POC: NEGATIVE
MUCUS UA: NEGATIVE
Yeast, UA: NEGATIVE

## 2015-01-20 LAB — POCT URINE PREGNANCY: Preg Test, Ur: NEGATIVE

## 2015-01-20 MED ORDER — FLUCONAZOLE 150 MG PO TABS: 150.0000 mg | ORAL_TABLET | Freq: Once | ORAL | Status: DC

## 2015-01-20 NOTE — Patient Instructions (Signed)
I will be in touch with the rest of your labs asap We will culture your urine to see if you may have a UTI, but at this time let's hold off on more antibiotics I will also get your metabolic profile tomorrow and will give you a call Eat a light diet- bananas, applesauce, toast, etc until you are feeling better- plenty of fluids  If you are getting worse or having more pain please seek care right away!

## 2015-01-20 NOTE — Progress Notes (Addendum)
[facility] [address] [place] [address] [phone]  Date:  01/20/2015   Name:  Rhonda Tran   DOB:  Feb 21, 1986   MRN:  [id number]  PCP:  Tonye Pearson, MD    Chief Complaint: Abdominal Pain and Vaginitis   History of Present Illness:  Rhonda Tran is a 29 y.o. very pleasant female patient who presents with the following:  She is here today with illness Today is Thursday On Monday she noted abd pain, and a "mucus- like" stool.  That day she also noted frequent urination. The next day she had a lot of pain in her belly when she would have a BM, but the mucus was resolved.  Yesterday she did not have a BM, but her stomach still hurt Today she had the pain again when she had a BM, and lesser pain that was there all day.    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  She normally has one stool a day, but had maybe 2-3 stools a day here recetnly  She no longer has any urinary sx- no dysuria or frequency  She was treated with 2 abx over the last couple of months for infected insect bites- doxy and then clindamycin  No history of abd surgery.  No family history of IBD She is here today with her husband She is a non- smoker  Patient Active Problem List   Diagnosis Date Noted  . History of percutaneous transcatheter closure of congenital ASD 06/14/2012  . ADD (attention deficit disorder) 06/14/2012  . Migraine headache 06/14/2012    History reviewed. No pertinent past medical history.  Past Surgical History  Procedure Laterality Date  . Cardiac surgery    . Cardiac surgery      History  Substance Use Topics  . Smoking status: Never Smoker   .  Smokeless tobacco: Never Used  . Alcohol Use: No    Family History  Problem Relation Age of Onset  . Cancer Maternal Grandmother   . Cancer Maternal Grandfather     No Known Allergies  Medication list has been reviewed and updated.  Current Outpatient Prescriptions on File Prior to Visit  Medication Sig Dispense Refill  . fluticasone (FLONASE) 50 MCG/ACT nasal spray 2spr qd 16 g 6  . lisdexamfetamine (VYVANSE) 70 MG capsule Take 1 capsule (70 mg total) by mouth daily. This represents a 90 day supply 90 capsule 0  . lisdexamfetamine (VYVANSE) 70 MG capsule Take 1 capsule (70 mg total) by mouth every morning. For 60d after signed 30 capsule 0  . lisdexamfetamine (VYVANSE) 70 MG capsule Take 1 capsule (70 mg total) by mouth every morning. For 30d after signed 30 capsule 0  . lisdexamfetamine (VYVANSE) 70 MG capsule Take 1 capsule (70 mg total) by mouth every morning. 30 capsule 0  . LO LOESTRIN FE 1 MG-10 MCG / 10 MCG tablet     . clindamycin (CLEOCIN) 300 MG capsule Take 1 capsule (300 mg total) by mouth 4 (four) times daily. (Patient not taking: Reported on 01/20/2015) 40 capsule 0  . triamcinolone cream (KENALOG) 0.1 % Apply 1 application topically 2 (two) times  daily. (Patient not taking: Reported on 01/20/2015) 30 g 0   No current facility-administered medications on file prior to visit.    Review of Systems:  As per HPI- otherwise negative.   Physical Examination: Filed Vitals:   01/20/15 1835  BP: 118/70  Pulse: 92  Temp: 98.6 F (37 C)  Resp: 16   Filed Vitals:   01/20/15 1835  Height: 5' 3.5" (1.613 m)  Weight: 162 lb (73.483 kg)   Body mass index is 28.24 kg/(m^2). Ideal Body Weight: Weight in (lb) to have BMI = 25: 143.1  GEN: WDWN, NAD, Non-toxic, A & O x 3, mild overweight, looks well HEENT: Atraumatic, Normocephalic. Neck supple. No masses, No LAD. Ears and Nose: No external deformity. CV: RRR, No M/G/R. No JVD. No thrill. No extra heart sounds. PULM:  CTA B, no wheezes, crackles, rhonchi. No retractions. No resp. distress. No accessory muscle use. ABD: S, ND, +BS. No rebound. No HSM.  She has mild lower abd tenderness, generalized.  On re-exam this is resolved EXTR: No c/c/e NEURO Normal gait.  PSYCH: Normally interactive. Conversant. Not depressed or anxious appearing.  Calm demeanor.  Pelvic: normal, no vaginal lesions or discharge. Vagina is a bit inflamed c/w yeast vaginitis. Uterus normal, no CMT, no adnexal tendereness or masses  Results for orders placed or performed in visit on 01/20/15  POCT CBC  Result Value Ref Range   WBC 7.5 4.6 - 10.2 K/uL   Lymph, poc 1.5 0.6 - 3.4   POC LYMPH PERCENT 20.1 10 - 50 %L   MID (cbc) 0.3 0 - 0.9   POC MID % 3.9 0 - 12 %M   POC Granulocyte 5.7 2 - 6.9   Granulocyte percent 76.0 37 - 80 %G   RBC 4.90 4.04 - 5.48 M/uL   Hemoglobin 13.2 12.2 - 16.2 g/dL   HCT, POC 16.139.3 09.637.7 - 47.9 %   MCV 80.2 80 - 97 fL   MCH, POC 27.0 27 - 31.2 pg   MCHC 33.7 31.8 - 35.4 g/dL   RDW, POC 04.512.8 %   Platelet Count, POC 320 142 - 424 K/uL   MPV 6.7 0 - 99.8 fL  POCT UA - Microscopic Only  Result Value Ref Range   WBC, Ur, HPF, POC 0-3    RBC, urine, microscopic 0-1    Bacteria, U Microscopic trace    Mucus, UA negative    Epithelial cells, urine per micros 0-3    Crystals, Ur, HPF, POC negative    Casts, Ur, LPF, POC negative    Yeast, UA negative   POCT urinalysis dipstick  Result Value Ref Range   Color, UA yellow    Clarity, UA clear    Glucose, UA negative    Bilirubin, UA negative    Ketones, UA negative    Spec Grav, UA 1.010    Blood, UA trace-lysed    pH, UA 6.0    Protein, UA negative    Urobilinogen, UA 0.2    Nitrite, UA negative    Leukocytes, UA small (1+) (A) Negative  POCT urine pregnancy  Result Value Ref Range   Preg Test, Ur Negative Negative  POCT Wet Prep with KOH  Result Value Ref Range   Trichomonas, UA Negative    Clue Cells Wet Prep HPF POC Negative    Epithelial  Wet Prep HPF POC Many Few, Moderate, Many   Yeast Wet Prep HPF POC moderate    Bacteria Wet Prep HPF  POC Moderate (A) Few   RBC Wet Prep HPF POC negative    WBC Wet Prep HPF POC 1-5    KOH Prep POC Positive    Assessment and Plan: Epigastric pain - Plan: POCT CBC, POCT urine pregnancy, Comprehensive metabolic panel, POCT Wet Prep with KOH  Urinary frequency - Plan: POCT UA - Microscopic Only, POCT urinalysis dipstick, Urine culture, POCT Wet Prep with KOH  Yeast vaginitis - Plan: fluconazole (DIFLUCAN) 150 MG tablet  Here today with complaint of abd discomfort and stools with mucus for abut 4 days.  Her belly is benign and labs so far reassuring.  Diflucan for yeast vaginitis.  Await her other labs and will follow-up with her.  If mucus stools persist will refer to GI for further evaluation   Signed Abbe Amsterdam, MD  Called and spoke with her on 7/16- she reported that her GI sx were "much better" although she still had some vaginal itching.  Let her know about re-dosing her dilflucan, follow-up if sx persist.  Seek care if mucus like stools return

## 2015-01-21 LAB — COMPREHENSIVE METABOLIC PANEL
ALK PHOS: 48 U/L (ref 39–117)
ALT: 11 U/L (ref 0–35)
AST: 12 U/L (ref 0–37)
Albumin: 4.2 g/dL (ref 3.5–5.2)
BUN: 8 mg/dL (ref 6–23)
CALCIUM: 9.2 mg/dL (ref 8.4–10.5)
CO2: 27 mEq/L (ref 19–32)
Chloride: 104 mEq/L (ref 96–112)
Creat: 0.81 mg/dL (ref 0.50–1.10)
GLUCOSE: 92 mg/dL (ref 70–99)
Potassium: 4.8 mEq/L (ref 3.5–5.3)
SODIUM: 138 meq/L (ref 135–145)
TOTAL PROTEIN: 7.3 g/dL (ref 6.0–8.3)
Total Bilirubin: 0.3 mg/dL (ref 0.2–1.2)

## 2015-01-22 LAB — URINE CULTURE
COLONY COUNT: NO GROWTH
ORGANISM ID, BACTERIA: NO GROWTH

## 2015-01-23 ENCOUNTER — Encounter: Payer: Self-pay | Admitting: Family Medicine

## 2015-03-15 ENCOUNTER — Other Ambulatory Visit: Payer: Self-pay | Admitting: Internal Medicine

## 2015-03-15 DIAGNOSIS — F988 Other specified behavioral and emotional disorders with onset usually occurring in childhood and adolescence: Secondary | ICD-10-CM

## 2015-03-16 MED ORDER — LISDEXAMFETAMINE DIMESYLATE 70 MG PO CAPS: 70.0000 mg | ORAL_CAPSULE | ORAL | Status: DC

## 2015-03-16 NOTE — Addendum Note (Signed)
Addended by: Tonye Pearson on: 03/16/2015 04:38 PM   Modules accepted: Orders

## 2015-03-17 NOTE — Telephone Encounter (Signed)
Rxs in drawer, notified pt by MyChart.

## 2015-04-13 ENCOUNTER — Telehealth: Payer: Self-pay

## 2015-04-13 NOTE — Telephone Encounter (Signed)
PA approved for Vyvanse from 03/23/15 - 04/12/16, case # [id number]. Notified pharm.

## 2015-06-06 ENCOUNTER — Encounter: Payer: Self-pay | Admitting: Internal Medicine

## 2015-06-08 ENCOUNTER — Ambulatory Visit (INDEPENDENT_AMBULATORY_CARE_PROVIDER_SITE_OTHER): Payer: BC Managed Care – PPO | Admitting: Internal Medicine

## 2015-06-08 ENCOUNTER — Encounter: Payer: Self-pay | Admitting: Internal Medicine

## 2015-06-08 VITALS — BP 114/79 | HR 94 | Temp 97.9°F | Resp 16 | Ht 64.0 in | Wt 156.0 lb

## 2015-06-08 DIAGNOSIS — F909 Attention-deficit hyperactivity disorder, unspecified type: Secondary | ICD-10-CM | POA: Diagnosis not present

## 2015-06-08 DIAGNOSIS — F988 Other specified behavioral and emotional disorders with onset usually occurring in childhood and adolescence: Secondary | ICD-10-CM

## 2015-06-08 MED ORDER — LISDEXAMFETAMINE DIMESYLATE 70 MG PO CAPS: 70.0000 mg | ORAL_CAPSULE | ORAL | Status: DC

## 2015-06-08 MED ORDER — FLUTICASONE PROPIONATE 50 MCG/ACT NA SUSP: NASAL | Status: DC

## 2015-06-08 NOTE — Progress Notes (Signed)
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 [place] and has had a very difficult year with discipline She continues to benefit from stimulant medication without side effects  She is healthy in general--needs refill Flonase Her migraine headaches in the past have not been an issue  Wt Readings from Last 3 Encounters:  06/08/15 156 lb (70.761 kg)  01/20/15 162 lb (73.483 kg)  12/15/14 163 lb 6.4 oz (74.118 kg)   Exam BP 114/79 mmHg  Pulse 94  Temp(Src) 97.9 F (36.6 C)  Resp 16  Ht 5\' 4"  (1.626 m)  Wt 156 lb (70.761 kg)  BMI 26.76 kg/m2 HEENT clear Heart regular Cranial nerves II through XII intact Mood good affect appropriate   Impression #1 attention deficit disorder Meds ordered this encounter  Medications  . lisdexamfetamine (VYVANSE) 70 MG capsule    Sig: Take 1 capsule (70 mg total) by mouth every morning. For 60d after signed    Dispense:  30 capsule    Refill:  0  . lisdexamfetamine (VYVANSE) 70 MG capsule    Sig: Take 1 capsule (70 mg total) by mouth every morning. For 30d after signed    Dispense:  30 capsule    Refill:  0  . lisdexamfetamine (VYVANSE) 70 MG capsule    Sig: Take 1 capsule (70 mg total) by mouth every morning.    Dispense:  30 capsule    Refill:  0  . fluticasone (FLONASE) 50 MCG/ACT nasal spray    Sig: 2spr qd    Dispense:  16 g    Refill:  6   Call in 3 months in follow-up in 6

## 2015-08-17 ENCOUNTER — Other Ambulatory Visit: Payer: Self-pay | Admitting: Internal Medicine

## 2015-08-17 DIAGNOSIS — F988 Other specified behavioral and emotional disorders with onset usually occurring in childhood and adolescence: Secondary | ICD-10-CM

## 2015-08-18 MED ORDER — LISDEXAMFETAMINE DIMESYLATE 70 MG PO CAPS: 70.0000 mg | ORAL_CAPSULE | ORAL | Status: DC

## 2015-08-18 NOTE — Telephone Encounter (Signed)
Meds ordered this encounter  Medications  . VYVANSE 70 MG capsule    Sig: TAKE 1 CAPSULE BY MOUTH EVERY MORNING    Dispense:  30 capsule    Refill:  0  . lisdexamfetamine (VYVANSE) 70 MG capsule    Sig: Take 1 capsule (70 mg total) by mouth every morning. For 60d after signed    Dispense:  30 capsule    Refill:  0  . lisdexamfetamine (VYVANSE) 70 MG capsule    Sig: Take 1 capsule (70 mg total) by mouth every morning. For 30d after signed    Dispense:  30 capsule    Refill:  0

## 2015-08-19 NOTE — Telephone Encounter (Signed)
Notified pt ready on vm. 

## 2015-09-07 ENCOUNTER — Ambulatory Visit: Payer: Self-pay | Admitting: Internal Medicine

## 2015-10-19 ENCOUNTER — Ambulatory Visit (INDEPENDENT_AMBULATORY_CARE_PROVIDER_SITE_OTHER): Payer: BC Managed Care – PPO | Admitting: Internal Medicine

## 2015-10-19 ENCOUNTER — Encounter: Payer: Self-pay | Admitting: Internal Medicine

## 2015-10-19 VITALS — BP 106/68 | HR 86 | Temp 98.7°F | Resp 16 | Ht 62.5 in | Wt 143.2 lb

## 2015-10-19 DIAGNOSIS — F909 Attention-deficit hyperactivity disorder, unspecified type: Secondary | ICD-10-CM

## 2015-10-19 DIAGNOSIS — R634 Abnormal weight loss: Secondary | ICD-10-CM | POA: Diagnosis not present

## 2015-10-19 DIAGNOSIS — F988 Other specified behavioral and emotional disorders with onset usually occurring in childhood and adolescence: Secondary | ICD-10-CM

## 2015-10-19 LAB — COMPREHENSIVE METABOLIC PANEL
ALT: 8 U/L (ref 6–29)
AST: 11 U/L (ref 10–30)
Albumin: 4.4 g/dL (ref 3.6–5.1)
Alkaline Phosphatase: 41 U/L (ref 33–115)
BILIRUBIN TOTAL: 0.4 mg/dL (ref 0.2–1.2)
BUN: 9 mg/dL (ref 7–25)
CALCIUM: 9.4 mg/dL (ref 8.6–10.2)
CO2: 26 mmol/L (ref 20–31)
Chloride: 103 mmol/L (ref 98–110)
Creat: 0.65 mg/dL (ref 0.50–1.10)
GLUCOSE: 95 mg/dL (ref 65–99)
POTASSIUM: 4.7 mmol/L (ref 3.5–5.3)
Sodium: 137 mmol/L (ref 135–146)
Total Protein: 7.5 g/dL (ref 6.1–8.1)

## 2015-10-19 LAB — CBC WITH DIFFERENTIAL/PLATELET
Basophils Absolute: 56 cells/uL (ref 0–200)
Basophils Relative: 1 %
Eosinophils Absolute: 56 cells/uL (ref 15–500)
Eosinophils Relative: 1 %
HEMATOCRIT: 41.9 % (ref 35.0–45.0)
Hemoglobin: 13.9 g/dL (ref 11.7–15.5)
LYMPHS PCT: 30 %
Lymphs Abs: 1680 cells/uL (ref 850–3900)
MCH: 28.1 pg (ref 27.0–33.0)
MCHC: 33.2 g/dL (ref 32.0–36.0)
MCV: 84.6 fL (ref 80.0–100.0)
MONO ABS: 336 {cells}/uL (ref 200–950)
MONOS PCT: 6 %
MPV: 9.4 fL (ref 7.5–12.5)
NEUTROS PCT: 62 %
Neutro Abs: 3472 cells/uL (ref 1500–7800)
PLATELETS: 322 10*3/uL (ref 140–400)
RBC: 4.95 MIL/uL (ref 3.80–5.10)
RDW: 13.2 % (ref 11.0–15.0)
WBC: 5.6 10*3/uL (ref 3.8–10.8)

## 2015-10-19 LAB — LIPID PANEL
CHOL/HDL RATIO: 2.9 ratio (ref <=5.0)
Cholesterol: 165 mg/dL (ref 125–200)
HDL: 57 mg/dL (ref >=46)
LDL Cholesterol: 90 mg/dL (ref <130)
TRIGLYCERIDES: 88 mg/dL (ref <150)
VLDL: 18 mg/dL (ref <30)

## 2015-10-19 MED ORDER — LISDEXAMFETAMINE DIMESYLATE 30 MG PO CAPS: 30.0000 mg | ORAL_CAPSULE | Freq: Every day | ORAL | Status: DC

## 2015-10-19 MED ORDER — LISDEXAMFETAMINE DIMESYLATE 50 MG PO CAPS: 50.0000 mg | ORAL_CAPSULE | Freq: Every day | ORAL | Status: DC

## 2015-10-19 NOTE — Progress Notes (Signed)
Wt Readings from Last 3 Encounters:  10/19/15 143 lb 3.2 oz (64.955 kg)  06/08/15 156 lb (70.761 kg)  01/20/15 162 lb (73.483 kg)   Replacement diet Started 1 month ago and she lost several pounds and now is continuing to lose although no longer dieting. No somatic symptoms related to this. No skin or hair changes. No mood issues. No fever chills or night sweats. She would like to be evaluated with labs to rule out thyroid and metabolic disorders including anemia.  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  Exam BP 106/68 mmHg  Pulse 86  Temp(Src) 98.7 F (37.1 C) (Oral)  Resp 16  Ht 5' 2.5" (1.588 m)  Wt 143 lb 3.2 oz (64.955 kg)  BMI 25.76 kg/m2  SpO2 98%  LMP 09/23/2015 HEENT clear without thyromegaly or nodules Heart regular without murmur Lungs clear Extremities clear Neurological intact Mood good affect appropriate  Impression ADD (attention deficit disorder)  Abnormal weight loss - Plan: CBC with Differential/Platelet, Comprehensive metabolic panel, TSH, Lipid panel  Meds ordered this encounter  Medications  . lisdexamfetamine (VYVANSE) 50 MG capsule    Sig: Take 1 capsule (50 mg total) by mouth daily.    Dispense:  30 capsule    Refill:  0  . lisdexamfetamine (VYVANSE) 30 MG capsule    Sig: Take 1 capsule (30 mg total) by mouth daily.    Dispense:  30 capsule    Refill:  0   She is given prescriptions for Vyvanse to let her lower her dose gradually. She has enough Vyvanse 70 mg finished the school year. She can use these 2 prescriptions during summer to see if she can wean off without having excessive fatigue Her pregnancy may  coincide with beginning the next school year and I advised her that she cannot take stimulants during pregnancy.

## 2015-10-19 NOTE — Patient Instructions (Signed)
Benny LennertSarah Weber PA-C manages ADD meds well

## 2015-10-20 LAB — TSH: TSH: 1.96 mIU/L

## 2016-04-27 ENCOUNTER — Ambulatory Visit (INDEPENDENT_AMBULATORY_CARE_PROVIDER_SITE_OTHER): Payer: BC Managed Care – PPO | Admitting: Physician Assistant

## 2016-04-27 VITALS — BP 124/74 | HR 88 | Temp 98.3°F | Resp 16 | Ht 62.75 in | Wt 171.2 lb

## 2016-04-27 DIAGNOSIS — J029 Acute pharyngitis, unspecified: Secondary | ICD-10-CM | POA: Diagnosis not present

## 2016-04-27 LAB — POCT RAPID STREP A (OFFICE): Rapid Strep A Screen: NEGATIVE

## 2016-04-27 NOTE — Progress Notes (Signed)
Patient ID: Blenda MountsKristin D Subia, female   DOB: 11/26/1985, 30 y.o.   MRN: [id number]   By signing my name below, I, Essence Howell, attest that this documentation has been prepared under the direction and in the presence of Deliah BostonMichael Clark, PA-C Electronically Signed: Charline BillsEssence Howell, ED Scribe 04/27/2016 at 3:37 PM.  04/27/2016 7:00 PM   DOB: 05/02/1986 / MRN: [id number]  SUBJECTIVE:  Blenda MountsKristin D Scipio is a 30 y.o. female presenting for an intermittent cough over the past month. Pt reports that she had a cough last month that lasted for 2 weeks but improved. She states that cough has returned this week along with hoarseness, nasal congestion onset this week, sore throat onset this afternoon, mild frontal HA. She has tried 2 doses of Mucinex. Pt denies fever, chills, ear pain, dental pain, sob. She has not had her flu vaccine this season. Pt reports a h/o childhood asthma but no flare up as an adult. No h/o tobacco use.   She has No Known Allergies.   She  has no past medical history on file.    She  reports that she has never smoked. She has never used smokeless tobacco. She reports that she does not drink alcohol or use drugs. She  reports that she does not engage in sexual activity. The patient  has a past surgical history that includes Cardiac surgery and Cardiac surgery.  Her family history includes Cancer in her maternal grandfather and maternal grandmother.  Review of Systems  Constitutional: Negative for chills and fever.  HENT: Positive for congestion and sore throat. Negative for ear pain.   Respiratory: Positive for cough. Negative for shortness of breath.   Neurological: Positive for headaches.    The problem list and medications were reviewed and updated by myself where necessary and exist elsewhere in the encounter.   OBJECTIVE:  BP 124/74    Pulse 88    Temp 98.3 F (36.8 C) (Oral)    Resp 16    Ht 5' 2.75" (1.594 m)    Wt 171 lb 3.2 oz (77.7 kg)    LMP 03/29/2016    SpO2 100%     BMI 30.57 kg/m   Physical Exam  Constitutional: She is oriented to person, place, and time. She appears well-developed and well-nourished. No distress.  HENT:  Head: Normocephalic and atraumatic.  Right Ear: Tympanic membrane normal.  Left Ear: Tympanic membrane normal.  Nose: Nose normal.  Mouth/Throat: Oropharynx is clear and moist.  TMs pearly grey with good cone of light bilaterally  Cardiovascular: Normal rate and regular rhythm.   Pulmonary/Chest: Effort normal and breath sounds normal. No respiratory distress.  Lymphadenopathy:    She has no cervical adenopathy.  No lymphadenopathy of head or neck.   Neurological: She is alert and oriented to person, place, and time.  Skin: Skin is warm and dry.  Psychiatric: She has a normal mood and affect.    Results for orders placed or performed in visit on 04/27/16 (from the past 72 hour(s))  POCT rapid strep A     Status: None   Collection Time: 04/27/16  4:06 PM  Result Value Ref Range   Rapid Strep A Screen Negative Negative    No results found.  ASSESSMENT AND PLAN  Belenda CruiseKristin was seen today for cough, stuffy nose and sore throat.  Diagnoses and all orders for this visit:  Sore throat: This is most likely viral.  She has no fever. Her exam is normal.  She is  trying to get pregnant.  I have advised she avoid all medications but tylenol.  -     POCT rapid strep A -     Culture, Group A Strep     The patient is advised to call or return to clinic if she does not see an improvement in symptoms, or to seek the care of the closest emergency department if she worsens with the above plan.   Deliah Boston, MHS, PA-C [facility] 04/27/2016 7:00 PM

## 2016-04-27 NOTE — Patient Instructions (Signed)
     IF you received an x-ray today, you will receive an invoice from [facility]. Please contact [facility] at [phone] with questions or concerns regarding your invoice.   IF you received labwork today, you will receive an invoice from [facility]. Please contact [facility] at [phone] with questions or concerns regarding your invoice.   Our billing staff will not be able to assist you with questions regarding bills from these companies.  You will be contacted with the lab results as soon as they are available. The fastest way to get your results is to activate your My Chart account. Instructions are located on the last page of this paperwork. If you have not heard from us regarding the results in 2 weeks, please contact this office.      

## 2016-04-29 LAB — CULTURE, GROUP A STREP: Organism ID, Bacteria: NORMAL

## 2016-06-14 LAB — OB RESULTS CONSOLE HEPATITIS B SURFACE ANTIGEN: HEP B S AG: NEGATIVE

## 2016-06-14 LAB — OB RESULTS CONSOLE GC/CHLAMYDIA
CHLAMYDIA, DNA PROBE: NEGATIVE
Gonorrhea: NEGATIVE

## 2016-06-14 LAB — OB RESULTS CONSOLE RPR: RPR: NONREACTIVE

## 2016-06-14 LAB — OB RESULTS CONSOLE HIV ANTIBODY (ROUTINE TESTING): HIV: NONREACTIVE

## 2016-06-14 LAB — OB RESULTS CONSOLE RUBELLA ANTIBODY, IGM: Rubella: IMMUNE

## 2016-07-09 DIAGNOSIS — O139 Gestational [pregnancy-induced] hypertension without significant proteinuria, unspecified trimester: Secondary | ICD-10-CM

## 2016-07-09 HISTORY — DX: Gestational (pregnancy-induced) hypertension without significant proteinuria, unspecified trimester: O13.9

## 2016-07-09 NOTE — L&D Delivery Note (Signed)
Delivery Note At  a viable and healthy female was delivered by svd over intact perineum  (Presentation: OA ;  ).  APGAR: 8,9 ; weight  not yet done.   Placenta status:delivered spontaneously, intace . 3vv Cord:  with the following complications: none.  Anesthesia:  Epidural Episiotomy:  none Lacerations:  1st degree perineal, bilat labial; perineal and small vaginal lac repaired and hemostatic  Suture Repair: 3.0 vicryl Est. Blood Loss (mL):  300  Mom to postpartum.  Baby to Couplet care / Skin to Skin. Mom and baby stable in room.  Vick FreesSusan E Carleen Rhue 01/12/2017, 5:01 AM

## 2016-12-12 LAB — OB RESULTS CONSOLE GBS: GBS: POSITIVE

## 2017-01-10 ENCOUNTER — Other Ambulatory Visit: Payer: Self-pay | Admitting: Obstetrics

## 2017-01-10 NOTE — H&P (Signed)
Rhonda MountsKristin D Tran is a 31 y.o. G1 at 4040'1 presenting for IOL. Pt notes rare contractions. Good fetal movement, No vaginal bleeding, not leaking fluid. Pt notes no HA, no RUQ pain, no vision change. Pt seen in office 7/5 at 40'0 due to elevated bp at home. Pt had been monitoring bps since 37 wks and called in the am due to persistent dbps in 90s.   [facility] at [facility] since 6 wks - Dated by 6 wk u/s - Gestational htn, Onset at 40 wks, Reactive fetal testing, benign exam. 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 Past Surgical History:  Procedure Laterality Date  . CARDIAC SURGERY    . CARDIAC SURGERY     Family History: family history includes Cancer in her maternal grandfather and maternal grandmother. Social History:  reports that she has never smoked. She has never used smokeless tobacco. She reports that she does not drink alcohol or use drugs.  Review of Systems - Negative except discomfort of pregnancy    Physical Exam:  Gen: well appearing, no distress CV: RRR Pulm: CTAB Back: no CVAT Abd: gravid, NT, no RUQ pain LE: 2+ edema, equal bilaterally, non-tender. 2+ DTR, no clonus    Prenatal labs: ABO, Rh:  A+ Antibody:  neg Rubella: !Error! immune RPR:   NR HBsAg:   neg HIV:   neg GBS:   POSITIVE 1 hr Glucola 132  Genetic screening nl quad Anatomy US normal   Assessment/Plan: 31 y.o. G1 with new  onset gestational htn. - gest htn, no evidence PEC. Check labs- CBC, CMP, Uric Acid, Treat bp as needed. - IOL, unripe cvx, plan cytotec x 2 or 3, then pitocin. Management per on-call MD. - GBS postive. PCN in active labor - Fetal growth 15%   Rhonda Tran A. 01/10/2017, 5:51 PM

## 2017-01-11 ENCOUNTER — Encounter (HOSPITAL_COMMUNITY): Payer: Self-pay

## 2017-01-11 ENCOUNTER — Inpatient Hospital Stay (HOSPITAL_COMMUNITY): Payer: BC Managed Care – PPO | Admitting: Anesthesiology

## 2017-01-11 ENCOUNTER — Inpatient Hospital Stay (HOSPITAL_COMMUNITY)
Admission: RE | Admit: 2017-01-11 | Discharge: 2017-01-14 | DRG: 775 | Disposition: A | Discharge status: Home / Self Care | Payer: BC Managed Care – PPO | Source: Ambulatory Visit | Attending: Obstetrics | Admitting: Obstetrics

## 2017-01-11 DIAGNOSIS — O139 Gestational [pregnancy-induced] hypertension without significant proteinuria, unspecified trimester: Secondary | ICD-10-CM | POA: Diagnosis present

## 2017-01-11 DIAGNOSIS — Z3A40 40 weeks gestation of pregnancy: Secondary | ICD-10-CM | POA: Diagnosis not present

## 2017-01-11 DIAGNOSIS — O99214 Obesity complicating childbirth: Secondary | ICD-10-CM | POA: Diagnosis present

## 2017-01-11 DIAGNOSIS — O99824 Streptococcus B carrier state complicating childbirth: Secondary | ICD-10-CM | POA: Diagnosis present

## 2017-01-11 DIAGNOSIS — Z6839 Body mass index (BMI) 39.0-39.9, adult: Secondary | ICD-10-CM

## 2017-01-11 DIAGNOSIS — O9902 Anemia complicating childbirth: Secondary | ICD-10-CM | POA: Diagnosis not present

## 2017-01-11 DIAGNOSIS — D649 Anemia, unspecified: Secondary | ICD-10-CM | POA: Diagnosis present

## 2017-01-11 DIAGNOSIS — E669 Obesity, unspecified: Secondary | ICD-10-CM | POA: Diagnosis present

## 2017-01-11 DIAGNOSIS — O134 Gestational [pregnancy-induced] hypertension without significant proteinuria, complicating childbirth: Principal | ICD-10-CM | POA: Diagnosis present

## 2017-01-11 HISTORY — DX: Cerebral infarction, unspecified: I63.9

## 2017-01-11 LAB — OB RESULTS CONSOLE ABO/RH: RH TYPE: POSITIVE

## 2017-01-11 LAB — COMPREHENSIVE METABOLIC PANEL
ALK PHOS: 144 U/L — AB (ref 38–126)
ALT: 16 U/L (ref 14–54)
AST: 22 U/L (ref 15–41)
Albumin: 3.1 g/dL — ABNORMAL LOW (ref 3.5–5.0)
Anion gap: 10 (ref 5–15)
BILIRUBIN TOTAL: 0.1 mg/dL — AB (ref 0.3–1.2)
BUN: 12 mg/dL (ref 6–20)
CALCIUM: 9.3 mg/dL (ref 8.9–10.3)
CO2: 21 mmol/L — ABNORMAL LOW (ref 22–32)
CREATININE: 0.62 mg/dL (ref 0.44–1.00)
Chloride: 102 mmol/L (ref 101–111)
Glucose, Bld: 94 mg/dL (ref 65–99)
Potassium: 4.8 mmol/L (ref 3.5–5.1)
Sodium: 133 mmol/L — ABNORMAL LOW (ref 135–145)
Total Protein: 6.9 g/dL (ref 6.5–8.1)

## 2017-01-11 LAB — URIC ACID: URIC ACID, SERUM: 5.2 mg/dL (ref 2.3–6.6)

## 2017-01-11 LAB — CBC
HCT: 35.9 % — ABNORMAL LOW (ref 36.0–46.0)
HCT: 35.9 % — ABNORMAL LOW (ref 36.0–46.0)
Hemoglobin: 12.2 g/dL (ref 12.0–15.0)
Hemoglobin: 11.9 g/dL — ABNORMAL LOW (ref 12.0–15.0)
MCH: 28.4 pg (ref 26.0–34.0)
MCH: 27.9 pg (ref 26.0–34.0)
MCHC: 34 g/dL (ref 30.0–36.0)
MCHC: 33.1 g/dL (ref 30.0–36.0)
MCV: 83.7 fL (ref 78.0–100.0)
MCV: 84.1 fL (ref 78.0–100.0)
PLATELETS: 292 10*3/uL (ref 150–400)
PLATELETS: 279 10*3/uL (ref 150–400)
RBC: 4.29 MIL/uL (ref 3.87–5.11)
RBC: 4.27 MIL/uL (ref 3.87–5.11)
RDW: 14.7 % (ref 11.5–15.5)
RDW: 14.7 % (ref 11.5–15.5)
WBC: 9.7 10*3/uL (ref 4.0–10.5)
WBC: 12.9 10*3/uL — AB (ref 4.0–10.5)

## 2017-01-11 LAB — TYPE AND SCREEN
ABO/RH(D): A POS
Antibody Screen: NEGATIVE

## 2017-01-11 LAB — RPR: RPR Ser Ql: NONREACTIVE

## 2017-01-11 LAB — ABO/RH: ABO/RH(D): A POS

## 2017-01-11 MED ORDER — LIDOCAINE HCL (PF) 1 % IJ SOLN
INTRAMUSCULAR | Status: DC | PRN
  Administered 2017-01-11: 13 mL via EPIDURAL

## 2017-01-11 MED ORDER — OXYCODONE-ACETAMINOPHEN 5-325 MG PO TABS: 1.0000 | ORAL_TABLET | ORAL | Status: DC | PRN

## 2017-01-11 MED ORDER — PENICILLIN G POT IN DEXTROSE 60000 UNIT/ML IV SOLN
3.0000 10*6.[IU] | INTRAVENOUS | Status: DC
  Administered 2017-01-11 – 2017-01-12 (×4): 3 10*6.[IU] via INTRAVENOUS
  Filled 2017-01-11 (×9): qty 50

## 2017-01-11 MED ORDER — OXYCODONE-ACETAMINOPHEN 5-325 MG PO TABS: 2.0000 | ORAL_TABLET | ORAL | Status: DC | PRN

## 2017-01-11 MED ORDER — FENTANYL 2.5 MCG/ML BUPIVACAINE 1/10 % EPIDURAL INFUSION (WH - ANES)
14.0000 mL/h | INTRAMUSCULAR | Status: DC | PRN
  Administered 2017-01-11 – 2017-01-12 (×2): 14 mL/h via EPIDURAL
  Filled 2017-01-11: qty 100

## 2017-01-11 MED ORDER — OXYTOCIN 40 UNITS IN LACTATED RINGERS INFUSION - SIMPLE MED
1.0000 m[IU]/min | INTRAVENOUS | Status: DC
  Administered 2017-01-11: 2 m[IU]/min via INTRAVENOUS
  Filled 2017-01-11: qty 1000

## 2017-01-11 MED ORDER — PENICILLIN G POTASSIUM 5000000 UNITS IJ SOLR
5.0000 10*6.[IU] | Freq: Once | INTRAVENOUS | Status: AC
  Administered 2017-01-11: 5 10*6.[IU] via INTRAVENOUS
  Filled 2017-01-11: qty 5

## 2017-01-11 MED ORDER — DIPHENHYDRAMINE HCL 50 MG/ML IJ SOLN: 12.5000 mg | INTRAMUSCULAR | Status: DC | PRN

## 2017-01-11 MED ORDER — OXYTOCIN 40 UNITS IN LACTATED RINGERS INFUSION - SIMPLE MED
2.5000 [IU]/h | INTRAVENOUS | Status: DC
  Administered 2017-01-12: 2.5 [IU]/h via INTRAVENOUS

## 2017-01-11 MED ORDER — PHENYLEPHRINE 40 MCG/ML (10ML) SYRINGE FOR IV PUSH (FOR BLOOD PRESSURE SUPPORT)
80.0000 ug | PREFILLED_SYRINGE | INTRAVENOUS | Status: DC | PRN
  Filled 2017-01-11: qty 5

## 2017-01-11 MED ORDER — OXYTOCIN 10 UNIT/ML IJ SOLN: 10.0000 [IU] | Freq: Once | INTRAMUSCULAR | Status: DC

## 2017-01-11 MED ORDER — ONDANSETRON HCL 4 MG/2ML IJ SOLN
4.0000 mg | Freq: Four times a day (QID) | INTRAMUSCULAR | Status: DC | PRN
  Administered 2017-01-11: 4 mg via INTRAVENOUS
  Filled 2017-01-11: qty 2

## 2017-01-11 MED ORDER — ZOLPIDEM TARTRATE 5 MG PO TABS: 5.0000 mg | ORAL_TABLET | Freq: Every evening | ORAL | Status: DC | PRN

## 2017-01-11 MED ORDER — TERBUTALINE SULFATE 1 MG/ML IJ SOLN
0.2500 mg | Freq: Once | INTRAMUSCULAR | Status: DC | PRN
  Filled 2017-01-11: qty 1

## 2017-01-11 MED ORDER — MISOPROSTOL 25 MCG QUARTER TABLET
25.0000 ug | ORAL_TABLET | ORAL | Status: AC | PRN
  Administered 2017-01-11 (×2): 25 ug via VAGINAL
  Filled 2017-01-11 (×2): qty 1

## 2017-01-11 MED ORDER — OXYTOCIN BOLUS FROM INFUSION
500.0000 mL | Freq: Once | INTRAVENOUS | Status: AC
  Administered 2017-01-12: 500 mL via INTRAVENOUS

## 2017-01-11 MED ORDER — EPHEDRINE 5 MG/ML INJ
10.0000 mg | INTRAVENOUS | Status: DC | PRN
  Filled 2017-01-11: qty 2

## 2017-01-11 MED ORDER — ACETAMINOPHEN 325 MG PO TABS: 650.0000 mg | ORAL_TABLET | ORAL | Status: DC | PRN

## 2017-01-11 MED ORDER — LACTATED RINGERS IV SOLN: 500.0000 mL | Freq: Once | INTRAVENOUS | Status: DC

## 2017-01-11 MED ORDER — PHENYLEPHRINE 40 MCG/ML (10ML) SYRINGE FOR IV PUSH (FOR BLOOD PRESSURE SUPPORT)
PREFILLED_SYRINGE | INTRAVENOUS | Status: AC
  Filled 2017-01-11: qty 10

## 2017-01-11 MED ORDER — LACTATED RINGERS IV SOLN
INTRAVENOUS | Status: DC
  Administered 2017-01-11 (×4): via INTRAVENOUS

## 2017-01-11 MED ORDER — LIDOCAINE HCL (PF) 1 % IJ SOLN
30.0000 mL | INTRAMUSCULAR | Status: DC | PRN
  Filled 2017-01-11: qty 30

## 2017-01-11 MED ORDER — FENTANYL 2.5 MCG/ML BUPIVACAINE 1/10 % EPIDURAL INFUSION (WH - ANES)
INTRAMUSCULAR | Status: AC
  Filled 2017-01-11: qty 100

## 2017-01-11 MED ORDER — LACTATED RINGERS IV SOLN: 500.0000 mL | INTRAVENOUS | Status: DC | PRN

## 2017-01-11 MED ORDER — SOD CITRATE-CITRIC ACID 500-334 MG/5ML PO SOLN: 30.0000 mL | ORAL | Status: DC | PRN

## 2017-01-11 MED ORDER — BUTORPHANOL TARTRATE 1 MG/ML IJ SOLN
1.0000 mg | Freq: Once | INTRAMUSCULAR | Status: AC
  Administered 2017-01-11: 1 mg via INTRAVENOUS
  Filled 2017-01-11 (×2): qty 1

## 2017-01-11 NOTE — Progress Notes (Signed)
Notes some ctx S/p cytotec x 2  O: VE closed/50/-3/ deviated to left Attempted balloon placement w/o success  tracing cat 1  Ctx q 2-4 mins  IMP . PIH Term GBS cx (+)  P) if unable to do third cytotec,, start pitocin and IV PCN.

## 2017-01-11 NOTE — Anesthesia Procedure Notes (Signed)
Epidural Patient location during procedure: OB Start time: 01/11/2017 9:20 PM End time: 01/11/2017 9:35 PM  Staffing Anesthesiologist: Anitra LauthMILLER, Maribel Luis RAY Performed: anesthesiologist   Preanesthetic Checklist Completed: patient identified, site marked, surgical consent, pre-op evaluation, timeout performed, IV checked, risks and benefits discussed and monitors and equipment checked  Epidural Patient position: sitting Prep: DuraPrep Patient monitoring: heart rate, cardiac monitor, continuous pulse ox and blood pressure Approach: midline Location: L2-L3 Injection technique: LOR saline  Needle:  Needle type: Tuohy  Needle gauge: 17 G Needle length: 9 cm Needle insertion depth: 7 cm Catheter type: closed end flexible Catheter size: 20 Guage Catheter at skin depth: 10 cm Test dose: negative  Assessment Events: blood not aspirated, injection not painful, no injection resistance, negative IV test and no paresthesia  Additional Notes Reason for block:procedure for pain

## 2017-01-11 NOTE — Progress Notes (Signed)
Pt comfortable, feels ctx but not too painful; not really noting more painful contractions Discussed foley balloon to help with cervical ripening and pt agrees to proceed  Patient Vitals for the past 24 hrs:  BP Temp Temp src Pulse Resp Height Weight  01/11/17 1616 (!) 149/98 - - 89 - - -  01/11/17 1535 - - - - 18 - -  01/11/17 1510 131/82 - - 77 - - -  01/11/17 1509 - - - - 18 - -  01/11/17 1406 131/82 (!) 97.5 F (36.4 C) Oral 86 20 - -  01/11/17 1340 137/80 - - 76 - - -  01/11/17 1307 (!) 142/89 - - 81 - - -  01/11/17 1136 (!) 141/85 - - 77 18 - -  01/11/17 1053 (!) 141/86 - - 90 18 - -  01/11/17 1016 139/76 98.2 F (36.8 C) Oral 90 18 - -  01/11/17 0816 137/86 - - (!) 102 18 - -  01/11/17 0614 136/84 98.4 F (36.9 C) Oral 92 - - -  01/11/17 0116 - - - - - 5\' 3"  (1.6 m) 225 lb (102.1 kg)  01/11/17 0048 (!) 135/95 97.6 F (36.4 C) Oral 97 18 - -    A&ox3 nml respirations Abd: soft, nt, gravid Cx: 1/50/-3; posterior; attempt to place cook balloon using stylet however difficult to place; sterile speculum placed and then ringed forceps then used to gently advance balloon through cervix; 60cc NS then inflated balloon and position confirmed; speculum removed and pt tolerated well  FHT: category 1; 130s, nml variability, +accels, no decels Toco: irregular ctx q 2 min, mild  A/p: G1P0 at 40 1 1. GHTN: contin IOL; will continue pitocin and now with foley bulb; plan svd 2. Bps normal to low mild range; contin to follow 3. gbs pos- contin prophylaxis 4. RH pos 5. Fetal status reassuring 6. H/o TIA d/t patent foramen ovale - does not require anticoagulation as she is s/p repair and not at increased risk for clot

## 2017-01-11 NOTE — Anesthesia Pain Management Evaluation Note (Signed)
  CRNA Pain Management Visit Note  Patient: Rhonda Tran, 31 y.o., female  "Hello I am a member of the anesthesia team at [facility]. We have an anesthesia team available at all times to provide care throughout the hospital, including epidural management and anesthesia for C-section. I don't know your plan for the delivery whether it a natural birth, water birth, IV sedation, nitrous supplementation, doula or epidural, but we want to meet your pain goals."   1.Was your pain managed to your expectations on prior hospitalizations?   No prior hospitalizations  2.What is your expectation for pain management during this hospitalization?     Epidural  3.How can we help you reach that goal? Epidural   Record the patient's initial score and the patient's pain goal.   Pain: 2  Pain Goal: 5 [facility] wants you to be able to say your pain was always managed very well.  Ethelyn Cerniglia 01/11/2017

## 2017-01-11 NOTE — Progress Notes (Signed)
Comfortable with epidural No c/o  Patient Vitals for the past 24 hrs:  BP Temp Temp src Pulse Resp Height Weight  01/11/17 2231 130/65 - - 94 18 - -  01/11/17 2201 120/63 - - (!) 104 18 - -  01/11/17 2156 126/66 - - 94 16 - -  01/11/17 2151 (!) 131/56 97.7 F (36.5 C) - 89 16 - -  01/11/17 2146 124/60 - - 98 18 - -  01/11/17 2141 126/68 - - 98 16 - -  01/11/17 2136 130/69 - - (!) 103 18 - -  01/11/17 2131 (!) 134/49 - - (!) 124 18 - -  01/11/17 2127 (!) 144/95 - - (!) 107 16 - -  01/11/17 2045 138/75 - - 82 18 - -  01/11/17 1942 128/78 97.7 F (36.5 C) Oral 90 18 - -  01/11/17 1819 137/69 98 F (36.7 C) Oral 71 17 - -  01/11/17 1616 (!) 149/98 - - 89 - - -  01/11/17 1535 - - - - 18 - -  01/11/17 1510 131/82 - - 77 - - -  01/11/17 1509 - - - - 18 - -  01/11/17 1406 131/82 (!) 97.5 F (36.4 C) Oral 86 20 - -  01/11/17 1340 137/80 - - 76 - - -  01/11/17 1307 (!) 142/89 - - 81 - - -  01/11/17 1136 (!) 141/85 - - 77 18 - -  01/11/17 1053 (!) 141/86 - - 90 18 - -  01/11/17 1016 139/76 98.2 F (36.8 C) Oral 90 18 - -  01/11/17 0816 137/86 - - (!) 102 18 - -  01/11/17 0614 136/84 98.4 F (36.9 C) Oral 92 - - -  01/11/17 0116 - - - - - 5\' 3"  (1.6 m) 225 lb (102.1 kg)  01/11/17 0048 (!) 135/95 97.6 F (36.4 C) Oral 97 18 - -     A&ox3 nml respirations Abd: soft, nt, gravid Cx: 4/80/-2; cephalic; arom with copious amount clear fluid; iupc and fse placed LE: trace edema, nt bilat  FHT: category 1; 130s, nml variability, +accels, no decels TOCO: about q 2 min  A/P: iup at 40 1 1. IOL - s/p foley bulb and continue pitocin; plan svd 2. GHTN: bps stable and nml, contin to follow; no s/s pre-e 3. Fetal status reassuring  4. gbs pos- contin pcn prophylaxis

## 2017-01-11 NOTE — Anesthesia Preprocedure Evaluation (Signed)
Anesthesia Evaluation  Patient identified by MRN, date of birth, ID band Patient awake    Reviewed: Allergy & Precautions, NPO status , Patient's Chart, lab work & pertinent test results  Airway Mallampati: II  TM Distance: >3 FB Neck ROM: Full    Dental no notable dental hx.    Pulmonary neg pulmonary ROS,    Pulmonary exam normal breath sounds clear to auscultation       Cardiovascular hypertension, negative cardio ROS Normal cardiovascular exam Rhythm:Regular Rate:Normal     Neuro/Psych  Headaches, TIAnegative neurological ROS  negative psych ROS   GI/Hepatic negative GI ROS, Neg liver ROS,   Endo/Other  negative endocrine ROS  Renal/GU negative Renal ROS  negative genitourinary   Musculoskeletal negative musculoskeletal ROS (+)   Abdominal   Peds negative pediatric ROS (+)  Hematology negative hematology ROS (+)   Anesthesia Other Findings   Reproductive/Obstetrics negative OB ROS (+) Pregnancy                             Anesthesia Physical Anesthesia Plan  ASA: III  Anesthesia Plan: Epidural   Post-op Pain Management:    Induction:   PONV Risk Score and Plan:   Airway Management Planned:   Additional Equipment:   Intra-op Plan:   Post-operative Plan:   Informed Consent:   Plan Discussed with:   Anesthesia Plan Comments:         Anesthesia Quick Evaluation

## 2017-01-12 ENCOUNTER — Encounter (HOSPITAL_COMMUNITY): Payer: Self-pay

## 2017-01-12 LAB — CBC
HEMATOCRIT: 34.2 % — AB (ref 36.0–46.0)
HEMOGLOBIN: 11.5 g/dL — AB (ref 12.0–15.0)
MCH: 28.1 pg (ref 26.0–34.0)
MCHC: 33.6 g/dL (ref 30.0–36.0)
MCV: 83.6 fL (ref 78.0–100.0)
Platelets: 241 10*3/uL (ref 150–400)
RBC: 4.09 MIL/uL (ref 3.87–5.11)
RDW: 14.6 % (ref 11.5–15.5)
WBC: 17.5 10*3/uL — ABNORMAL HIGH (ref 4.0–10.5)

## 2017-01-12 MED ORDER — PRENATAL MULTIVITAMIN CH
1.0000 | ORAL_TABLET | Freq: Every day | ORAL | Status: DC
  Administered 2017-01-12 – 2017-01-14 (×3): 1 via ORAL
  Filled 2017-01-12 (×3): qty 1

## 2017-01-12 MED ORDER — ONDANSETRON HCL 4 MG/2ML IJ SOLN: 4.0000 mg | INTRAMUSCULAR | Status: DC | PRN

## 2017-01-12 MED ORDER — COCONUT OIL OIL
1.0000 "application " | TOPICAL_OIL | Status: DC | PRN
  Administered 2017-01-14: 1 via TOPICAL
  Filled 2017-01-12: qty 120

## 2017-01-12 MED ORDER — DIPHENHYDRAMINE HCL 25 MG PO CAPS: 25.0000 mg | ORAL_CAPSULE | Freq: Four times a day (QID) | ORAL | Status: DC | PRN

## 2017-01-12 MED ORDER — ONDANSETRON HCL 4 MG PO TABS: 4.0000 mg | ORAL_TABLET | ORAL | Status: DC | PRN

## 2017-01-12 MED ORDER — IBUPROFEN 600 MG PO TABS
600.0000 mg | ORAL_TABLET | Freq: Four times a day (QID) | ORAL | Status: DC
  Administered 2017-01-12 – 2017-01-14 (×9): 600 mg via ORAL
  Filled 2017-01-12 (×9): qty 1

## 2017-01-12 MED ORDER — DIBUCAINE 1 % RE OINT: 1.0000 "application " | TOPICAL_OINTMENT | RECTAL | Status: DC | PRN

## 2017-01-12 MED ORDER — WITCH HAZEL-GLYCERIN EX PADS
1.0000 "application " | MEDICATED_PAD | CUTANEOUS | Status: DC | PRN
  Administered 2017-01-12: 1 via TOPICAL

## 2017-01-12 MED ORDER — SIMETHICONE 80 MG PO CHEW
80.0000 mg | CHEWABLE_TABLET | ORAL | Status: DC | PRN
Start: 2017-01-12 — End: 2017-01-14

## 2017-01-12 MED ORDER — ACETAMINOPHEN 325 MG PO TABS: 650.0000 mg | ORAL_TABLET | ORAL | Status: DC | PRN

## 2017-01-12 MED ORDER — BENZOCAINE-MENTHOL 20-0.5 % EX AERO: 1.0000 "application " | INHALATION_SPRAY | CUTANEOUS | Status: DC | PRN

## 2017-01-12 NOTE — Lactation Note (Signed)
This note was copied from a baby's chart. Lactation Consultation Note  Patient Name: Rhonda Tran ZOXWR'UToday's Date: 01/12/2017 Reason for consult: Initial assessment  Initial visit at 14 hours of age.  Mom reports a few good feedings and now baby is sleepy.  Mom holding baby STS with baby asleep.  LC instructed mom on hand expression with small drops applied to baby's mouth.  Baby remains sleepy and STS. [facility] resources given and discussed.  Encouraged to feed with early cues on demand 8-12x/24 hours.  Early newborn behavior discussed.  FOB at bedside supportive.  Mom to call for assist as needed.     Maternal Data Has patient been taught Hand Expression?: Yes Does the patient have breastfeeding experience prior to this delivery?: No  Feeding Feeding Type: Breast Fed  LATCH Score/Interventions Latch: Too sleepy or reluctant, no latch achieved, no sucking elicited. Intervention(s): Skin to skin;Teach feeding cues;Waking techniques Intervention(s): Adjust position;Assist with latch;Breast massage;Breast compression  Audible Swallowing: None Intervention(s): Skin to skin;Hand expression Intervention(s): Alternate breast massage  Type of Nipple: Everted at rest and after stimulation  Comfort (Breast/Nipple): Soft / non-tender     Hold (Positioning): Assistance needed to correctly position infant at breast and maintain latch. Intervention(s): Breastfeeding basics reviewed;Support Pillows;Position options;Skin to skin  LATCH Score: 5  Lactation Tools Discussed/Used     Consult Status Consult Status: Follow-up Date: 01/13/17 Follow-up type: In-patient    Juandaniel Manfredo, Arvella MerlesJana Lynn 01/12/2017, 7:17 PM

## 2017-01-12 NOTE — Anesthesia Postprocedure Evaluation (Signed)
Anesthesia Post Note  Patient: Rhonda Tran  Procedure(s) Performed: * No procedures listed *     Patient location during evaluation: Mother Baby Anesthesia Type: Epidural Level of consciousness: awake and alert Pain management: pain level controlled Vital Signs Assessment: post-procedure vital signs reviewed and stable Respiratory status: spontaneous breathing, nonlabored ventilation and respiratory function stable Cardiovascular status: stable Postop Assessment: no headache, no backache, epidural receding and patient able to bend at knees Anesthetic complications: no    Last Vitals:  Vitals:   01/12/17 0730 01/12/17 1139  BP: 114/68 119/68  Pulse: 73 97  Resp: 16 18  Temp: 36.7 C 36.6 C    Last Pain:  Vitals:   01/12/17 1530  TempSrc:   PainSc: 0-No pain   Pain Goal:                 Rica RecordsICKELTON,Jamarco Zaldivar

## 2017-01-13 DIAGNOSIS — O9902 Anemia complicating childbirth: Secondary | ICD-10-CM

## 2017-01-13 HISTORY — DX: Anemia complicating childbirth: O99.02

## 2017-01-13 LAB — CBC
HCT: 27.4 % — ABNORMAL LOW (ref 36.0–46.0)
Hemoglobin: 9.2 g/dL — ABNORMAL LOW (ref 12.0–15.0)
MCH: 28.3 pg (ref 26.0–34.0)
MCHC: 33.2 g/dL (ref 30.0–36.0)
MCV: 85.1 fL (ref 78.0–100.0)
PLATELETS: 202 10*3/uL (ref 150–400)
RBC: 3.22 MIL/uL — ABNORMAL LOW (ref 3.87–5.11)
RDW: 15.1 % (ref 11.5–15.5)
WBC: 10.8 10*3/uL — ABNORMAL HIGH (ref 4.0–10.5)

## 2017-01-13 MED ORDER — MAGNESIUM OXIDE 400 (241.3 MG) MG PO TABS
400.0000 mg | ORAL_TABLET | Freq: Every day | ORAL | Status: DC
  Filled 2017-01-13 (×3): qty 1

## 2017-01-13 MED ORDER — POLYSACCHARIDE IRON COMPLEX 150 MG PO CAPS
150.0000 mg | ORAL_CAPSULE | Freq: Every day | ORAL | Status: DC
  Filled 2017-01-13: qty 1

## 2017-01-13 NOTE — Progress Notes (Signed)
PPD # 1 SVD Information for the patient's newborn:  Leola BrazilChandler, Boy Cira [[id number]][[id number]]  female    breast feeding  / Circumcision planned tomorrow prior to DC Baby name: Hiram GashCorbin  S:  Reports feeling well             Tolerating po/ No nausea or vomiting             Bleeding is decreased             Pain controlled with ibuprofen (OTC)             Up ad lib / ambulatory / voiding without difficulties        O:  A & O x 3, in no apparent distress              VS:  Vitals:   01/12/17 0730 01/12/17 1139 01/12/17 1920 01/13/17 0600  BP: 114/68 119/68 117/69 121/75  Pulse: 73 97 90 83  Resp: 16 18 18 18   Temp: 98 F (36.7 C) 97.9 F (36.6 C) 97.7 F (36.5 C) (!) 97.5 F (36.4 C)  TempSrc: Oral Oral Oral Oral  Weight:      Height:        LABS:  Recent Labs  01/12/17 0537 01/13/17 0546  WBC 17.5* 10.8*  HGB 11.5* 9.2*  HCT 34.2* 27.4*  PLT 241 202    Blood type: --/--/A POS, A POS (07/06 0130)  Rubella: Immune (12/07 0000)   I&O: I/O last 3 completed shifts: In: -  Out: 300 [Blood:300]          No intake/output data recorded.  Lungs: Clear and unlabored  Heart: regular rate and rhythm / no murmurs  Abdomen: soft, non-tender, non-distended             Fundus: firm, non-tender, U-@  Perineum: no edema, repair intact  Lochia: scant  Extremities: trace edema, no calf pain or tenderness    A/P: PPD # 1 30 y.o., G1P1001   Principal Problem:   Postpartum care following vaginal delivery 7/7 Active Problems:   PIH (pregnancy induced hypertension)  - normotensive, no s/s PEC   First degree perineal laceration during delivery   Maternal anemia, with delivery  - asymptomatic  - start oral Fe and Mag ox  Doing well - stable status  Routine post partum orders  Anticipate discharge tomorrow    Neta Mendsaniela C Paul, MSN, CNM 01/13/2017, 10:20 AM

## 2017-01-13 NOTE — Lactation Note (Signed)
This note was copied from a baby's chart. Lactation Consultation Note  Patient Name: Rhonda Alden HippKristin Tran EAVWU'JToday's Date: 01/13/2017 Reason for consult: Follow-up assessment;Difficult latch;Breast/nipple pain  Follow up visit at 40 hours of age. Mom reports feedings are going well and baby is latched now.  LC noted baby is not active with feeding, mom is holding baby in cradle hold.  LC noted shallow latch and lower lip curled in, Lc removed baby from latch.  Mom has large evert nipples with compression stripe/crack bilaterally.  Mom reports difficulty hand expressing even drops of colostrum.   Baby has had 7 stools in lifetime, and in the past 24 hours 3 stools and 2 voids.  Baby had 5% weight loss at 24 hours of age with 7 stools prior to weight check.  Lc noted decrease in output through out today.   LC offered to assist with latching after several minutes, LC noted a small drop from each breast.  Lc assisted with cross cradle hold on right breast.  Baby opens mouth wide with deep latch and strong sucking.  Mom denies pain.  Baby needs stimulation to maintain feeding.   Mom is able to independently re-latch baby with good deep latch and denies pain.  FOB at bedside supportive. LC discussed using DEBP to increase stimulation to breast and offer supplement if available. Mom is agreeable and has an off brand DEBP for personal use. LC set up hospital pump and encouraged mom to post pump on initiate phase then workon hand expression and offer any EBM back to baby at next feeding.   FOB changed a wet diaper with uric acid noted in diaper and small stool smear.  Mom to call for assist as needed.  [facility], [facility]onda [facility]an.   Maternal Data Has patient been taught Hand Expression?: Yes Does the patient have breastfeeding experience prior to this delivery?: No  Feeding Feeding Type: Breast Fed Length of feed:  (10)  LATCH Score/Interventions Latch: Repeated attempts needed to sustain latch, nipple  held in mouth throughout feeding, stimulation needed to elicit sucking reflex. Intervention(s): Skin to skin;Teach feeding cues;Waking techniques Intervention(s): Breast massage;Breast compression  Audible Swallowing: A few with stimulation Intervention(s): Hand expression;Skin to skin Intervention(s): Hand expression;Alternate breast massage  Type of Nipple: Everted at rest and after stimulation  Comfort (Breast/Nipple): Filling, red/small blisters or bruises, mild/mod discomfort  Problem noted: Cracked, bleeding, blisters, bruises Interventions  (Cracked/bleeding/bruising/blister): Double electric pump;Expressed breast milk to nipple  Hold (Positioning): Assistance needed to correctly position infant at breast and maintain latch. Intervention(s): Breastfeeding basics reviewed;Support Pillows;Position options;Skin to skin  LATCH Score: 6  Lactation Tools Discussed/Used Pump Review: Setup, frequency, and cleaning;Milk Storage Initiated by:: JS IBCLC Date initiated:: 01/14/17   Consult Status Consult Status: Follow-up Date: 01/14/17 Follow-up type: In-patient    Rhonda Tran, Arvella MerlesJana Tran 01/13/2017, 9:17 PM

## 2017-01-14 MED ORDER — IBUPROFEN 600 MG PO TABS: 600.0000 mg | ORAL_TABLET | Freq: Four times a day (QID) | ORAL | 0 refills | Status: DC

## 2017-01-14 MED ORDER — FERROUS SULFATE 325 (65 FE) MG PO TABS: 325.0000 mg | ORAL_TABLET | Freq: Two times a day (BID) | ORAL | 3 refills | Status: DC

## 2017-01-14 NOTE — Lactation Note (Addendum)
This note was copied from a baby's chart. Lactation Consultation Note: Observed infant in cross cradle hold breastfeeding with good deep latch. Mother taught breast compression. Observed infant with frequent suckles and swallows. Mother advised to continue to cue base feed and to allow for normal cluster feeding. Suggested that mother pump 2-4 times per day until milk comes to volume. Mother has a Programme researcher, broadcasting/film/video[employer]in[employer] electric pump and her friend gave her a Medela pump. Mother assist with football hold and latched infant on using nipple to nose technique. Assist mother with hand expression and observed colostrum drops. Mother reminded to do good breast massage and to ice breast to reduce swelling as needed. Suggested that mother pre pump as needed to firm nipple. [facility]. appt was scheduled for July 11 at 8:30. Mother is aware of number to phone office with any breastfeeding questions or concerns.   Patient Name: Rhonda Alden HippKristin Tran [id number]'UToday's Date: 01/14/2017 Reason for consult: Follow-up assessment   Maternal Data    Feeding Feeding Type: Breast Fed Length of feed: 30 min  LATCH Score/Interventions Latch: Grasps breast easily, tongue down, lips flanged, rhythmical sucking. Intervention(s): Assist with latch;Breast compression  Audible Swallowing: Spontaneous and intermittent  Type of Nipple: Everted at rest and after stimulation  Comfort (Breast/Nipple): Filling, red/small blisters or bruises, mild/mod discomfort  Problem noted: Cracked, bleeding, blisters, bruises Interventions  (Cracked/bleeding/bruising/blister): Double electric pump Interventions (Mild/moderate discomfort): Hand massage;Post-pump  Hold (Positioning): No assistance needed to correctly position infant at breast. Intervention(s): Support Pillows;Position options  LATCH Score: 9  Lactation Tools Discussed/Used     Consult Status Consult Status: Follow-up Date: 01/16/17 Follow-up  type: Out-patient    Stevan BornKendrick, [name] 01/14/2017, 1:34 PM

## 2017-01-14 NOTE — Progress Notes (Signed)
Attempted to see pt. And she was sleeping.  Pt's husband and nurse states she did not sleep overnight, and was advised to come back after lunch.  Rhonda Tran, CNM

## 2017-01-14 NOTE — Lactation Note (Signed)
This note was copied from a baby's chart. Lactation Consultation Note  P1, Baby 53 hours.  Mother's nipples are sore with slight cracks. Provided mother with coconut oil and shells to wear. Reviewed hand expression.  Drops expressed from R side and not from left. Encouraged mother to pump in addition to breastfeeding at least 4-6 times per day. Fitted mother with L side #27 and R with #24 flange for pump. Give baby back volume pumped after next feeding. Reviewed engorgement care and monitoring voids/stools. Mom encouraged to feed baby 8-12 times/24 hours and with feeding cues.  Discussed depth and encouraged mother to call for further assistance as needed.  Patient Name: Boy Alden HippKristin Anger [id number]'XToday's Date: 01/14/2017 Reason for consult: Follow-up assessment   Maternal Data    Feeding Feeding Type: Breast Fed Length of feed: 30 min  LATCH Score/Interventions                      Lactation Tools Discussed/Used     Consult Status Consult Status: Complete    Hardie PulleyBerkelhammer, Leocadia Idleman Boschen 01/14/2017, 9:53 AM

## 2017-01-14 NOTE — Discharge Summary (Signed)
Obstetric Discharge Summary   Patient Name: Rhonda Tran DOB: 07/07/1986 MRN: [id number]  Date of Admission: 01/11/2017 Date of Discharge: 01/14/2017 Date of Delivery: 01/12/17 Gestational Age at Delivery: [redacted]w[redacted]d  Primary OB: [facility] - Dr. Ernestina Penna   Antepartum complications:  - Gestational htn, Onset at 40 wks, Reactive fetal testing, benign exam. Move to delivery, Check PIH labs on admit - h/o TIA at age 31, due to patent foramen ovale, amplatzar septal occluder placed, no need for thromboprophylaxis - GBS bacteruria - Obesity  Prenatal Labs:  ABO, Rh:  A+ Antibody:  neg Rubella: immune RPR:   NR HBsAg:   neg HIV:   neg GBS:   POSITIVE 1 hr Glucola 132          Genetic screening nl quad Anatomy US normal  Admitting Diagnosis: IOL for Gestational Hypertension  Secondary Diagnoses: Patient Active Problem List   Diagnosis Date Noted  . Maternal anemia, with delivery 01/13/2017  . Postpartum care following vaginal delivery 7/7 01/12/2017  . First degree perineal laceration during delivery 01/12/2017  . PIH (pregnancy induced hypertension) 01/11/2017  . History of percutaneous transcatheter closure of congenital ASD 06/14/2012  . ADD (attention deficit disorder) 06/14/2012  . Migraine headache 06/14/2012   Induction: Cytotec  Augmentation: Foley bulb, AROM, Pitocin Complications: None  Date of Delivery: 01/12/17 Delivered By: Dr. Amado Nash Delivery Type: spontaneous vaginal delivery Anesthesia: epidural Placenta: sponatneous Laceration: 1st degree perineal, bilat labial; perineal and small vaginal lac repaired and hemostatic  Episiotomy: none  Newborn Data: Live born female  Birth Weight: 6 lb 13.4 oz (3100 g) APGAR: 8, 9  Postpartum Course  (Vaginal Delivery): Patient had an uncomplicated postpartum course.  By time of discharge on PPD#2, her pain was controlled on oral pain medications; she had appropriate lochia and was ambulating, voiding without  difficulty and tolerating regular diet.  She was deemed stable for discharge to home.    Labs: CBC Latest Ref Rng & Units 01/13/2017 01/12/2017 01/11/2017  WBC 4.0 - 10.5 K/uL 10.8(H) 17.5(H) 12.9(H)  Hemoglobin 12.0 - 15.0 g/dL 4.0(J) 11.5(L) 11.9(L)  Hematocrit 36.0 - 46.0 % 27.4(L) 34.2(L) 35.9(L)  Platelets 150 - 400 K/uL 202 241 279   A POS  Physical exam:  BP 131/77 (BP Location: Right Arm)   Pulse 88   Temp 97.9 F (36.6 C) (Oral)   Resp 18   Ht 5\' 3"  (1.6 m)   Wt 102.1 kg (225 lb)   LMP 03/29/2016   Breastfeeding? Unknown   BMI 39.86 kg/m  General: alert and no distress Pulm: normal respiratory effort Lochia: appropriate Abdomen: soft, NT Uterine Fundus: firm, below umbilicus Perineum: healing well, no significant erythema, no significant edema Extremities: No evidence of DVT seen on physical exam. No lower extremity edema.  Disposition: stable, discharge to home Baby Feeding: breast milk Baby Disposition: home with mom  Contraception: OCPs  Rh Immune globulin given: N/A Rubella vaccine given: N/A Tdap vaccine given in AP or PP setting: UTD Flu vaccine given in AP or PP setting: UTD   Plan:  Rhonda Tran was discharged to home in good condition. Follow-up appointment at [facility] in 1 week for a BP check as pt. Does not live in [facility], then in 6 weeks for PP visit.   Discharge Instructions: Per After Visit Summary. Pe-eclampsia precautions reviewed.  Activity: Advance as tolerated. Pelvic rest for 6 weeks.  Refer to After Visit Summary Diet: Regular Discharge Medications: Allergies as of 01/14/2017  No Known Allergies     Medication List    STOP taking these medications   SLOW FE PO     TAKE these medications   ferrous sulfate 325 (65 FE) MG tablet Take 1 tablet (325 mg total) by mouth 2 (two) times daily with a meal.   ibuprofen 600 MG tablet Commonly known as:  ADVIL,MOTRIN Take 1 tablet (600 mg total) by mouth every 6 (six)  hours.   prenatal multivitamin Tabs tablet Take 1 tablet by mouth daily at 12 noon.   PROBIOTIC DAILY PO Take 1 tablet by mouth daily.      Outpatient follow up:  Follow-up Information    Noland FordyceFogleman, Kelly, MD. Schedule an appointment as soon as possible for a visit on 01/18/2017.   Specialty:  Obstetrics and Gynecology Why:  Postpartum BP check, then for 6 week PP visit  Contact information: [address] [place] [id number] [phone]           Signed:  Carlean JewsMeredith Yael Angerer, MSN, CNM [facility]

## 2017-01-14 NOTE — Progress Notes (Signed)
PPD #2, SVD, 1st degree perineal, baby boy  S:  Reports feeling good, and ready to be discharged home             Tolerating po/ No nausea or vomiting / denies dizziness, HA, visual changes, RUQ/epigastric pain              Bleeding is light             Pain controlled with Motrin             Up ad lib / ambulatory / voiding QS  Newborn breast feeding - having some difficulty with latching with some nipple soreness, but feels like it is getting better  / Circumcision done today   O:               VS: BP 121/75   Pulse 83   Temp (!) 97.5 F (36.4 C) (Oral)   Resp 18   Ht 5\' 3"  (1.6 m)   Wt 102.1 kg (225 lb)   LMP 03/29/2016   Breastfeeding? Unknown   BMI 39.86 kg/m    LABS:              Recent Labs  01/12/17 0537 01/13/17 0546  WBC 17.5* 10.8*  HGB 11.5* 9.2*  PLT 241 202               Blood type: --/--/A POS, A POS (07/06 0130)  Rubella: Immune (12/07 0000)                              Physical Exam:             Alert and oriented X3  Lungs: Clear and unlabored  Heart: regular rate and rhythm / no mumurs  Abdomen: soft, non-tender, non-distended              Fundus: firm, non-tender, U-1  Perineum: well approximated 1st degree laceration, bilateral labial, and vaginal lac healing well, no significant edema or erythema  Lochia: appropriate, no clots  Extremities: +2 BLE edema, no calf pain or tenderness    A: PPD # 1, SVD  Gestational HTN - stable, delivered   1st degree perineal laceration - healing welll  ABL Anemia - stable, asymptomatic   GBS Positive   Hx. Of TIA at age 31, due to patent foramen ovale  Doing well - stable status  P: Routine post partum orders  Discharge home today - WOB booklet and discharge instructions given   F/u on Friday for BP check - [place]  Meredith Sigmon, MSN, [facility]

## 2017-03-27 ENCOUNTER — Ambulatory Visit (INDEPENDENT_AMBULATORY_CARE_PROVIDER_SITE_OTHER): Payer: BC Managed Care – PPO | Admitting: Physician Assistant

## 2017-03-27 ENCOUNTER — Encounter: Payer: Self-pay | Admitting: Physician Assistant

## 2017-03-27 VITALS — BP 121/80 | HR 80 | Temp 98.4°F | Resp 16 | Ht 63.0 in | Wt 200.6 lb

## 2017-03-27 DIAGNOSIS — Z23 Encounter for immunization: Secondary | ICD-10-CM | POA: Diagnosis not present

## 2017-03-27 DIAGNOSIS — R198 Other specified symptoms and signs involving the digestive system and abdomen: Secondary | ICD-10-CM

## 2017-03-27 DIAGNOSIS — F988 Other specified behavioral and emotional disorders with onset usually occurring in childhood and adolescence: Secondary | ICD-10-CM | POA: Diagnosis not present

## 2017-03-27 MED ORDER — LISDEXAMFETAMINE DIMESYLATE 50 MG PO CAPS: 50.0000 mg | ORAL_CAPSULE | Freq: Every day | ORAL | 0 refills | Status: DC

## 2017-03-27 NOTE — Patient Instructions (Signed)
     IF you received an x-ray today, you will receive an invoice from [facility]. Please contact [facility] at [phone] with questions or concerns regarding your invoice.   IF you received labwork today, you will receive an invoice from [facility]. Please contact [facility] at [phone] with questions or concerns regarding your invoice.   Our billing staff will not be able to assist you with questions regarding bills from these companies.  You will be contacted with the lab results as soon as they are available. The fastest way to get your results is to activate your My Chart account. Instructions are located on the last page of this paperwork. If you have not heard from us regarding the results in 2 weeks, please contact this office.     

## 2017-03-27 NOTE — Progress Notes (Signed)
Patient ID: Rhonda Tran, female     DOB: 1986-04-16, 31 y.o.    MRN: [id number]  PCP: Porfirio Oar, PA-C  Chief Complaint  Patient presents with  . Medication Refill    vyvanse 70 mg    Subjective:   This patient is new to me and presents for evaluation of ADD.  Initially started Vyvanse in 2007.  It worked well and she tolerated it without difficulty. She tapered off for her pregnancy.  Her son was born 01/12/2017.  Since then, she has experienced "gurgling" noises from her abdomen and has noticed some mucous in her stools. Bright red blood on the TP. No hematochezia. No melena. She has history of an anal fissure. She has intermittent diarrhea and constipation. Treated for group B strep at the time of her son's delivery. Previous symptoms like this following antibiotic therapy in the past. Has resumed probiotic, which was the treatment before. She has also increased dietary fiber with vegetables and increased her water intake to 100 oz/day.  She is NOT breast feeding.  No abdominal pain. No fever. No nausea/vomiting.  Review of Systems As above.  Prior to Admission medications   Medication Sig Start Date End Date Taking? Authorizing Provider  LO LOESTRIN FE 1 MG-10 MCG / 10 MCG tablet  02/21/17  Yes [provider]  ferrous sulfate 325 (65 FE) MG tablet Take 1 tablet (325 mg total) by mouth 2 (two) times daily with a meal. Patient not taking: Reported on 03/27/2017 01/14/17 01/14/18  Karena Addison, CNM  ibuprofen (ADVIL,MOTRIN) 600 MG tablet Take 1 tablet (600 mg total) by mouth every 6 (six) hours. Patient not taking: Reported on 03/27/2017 01/14/17   Karena Addison, CNM  Prenatal Vit-Fe Fumarate-FA (PRENATAL MULTIVITAMIN) TABS tablet Take 1 tablet by mouth daily at 12 noon.    [provider]  Probiotic Product (PROBIOTIC DAILY PO) Take 1 tablet by mouth daily.    [provider]     No Known Allergies   Patient Active  Problem List   Diagnosis Date Noted  . History of percutaneous transcatheter closure of congenital ASD 06/14/2012  . ADD (attention deficit disorder) 06/14/2012  . Migraine headache 06/14/2012     Family History  Problem Relation Age of Onset  . Cancer Maternal Grandmother   . Cancer Maternal Grandfather      Social History   Social History  . Marital status: Married    Spouse name: Jilda Panda  . Number of children: 1  . Years of education: Master's Degree   Occupational History  . 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 Smoking status: Never Smoker  . Smokeless tobacco: Never Used  . Alcohol use No  . Drug use: No  . Sexual activity: No   Other Topics Concern  . Not on file   Social History Narrative   Lives with her husband, their son (born 01/2017) and their dog.   Family lives nearby.   Mother cares for her son while she is working.         Objective:  Physical Exam  Constitutional: She is oriented to person, place, and time. She appears well-developed and well-nourished. She is active and cooperative. No distress.  BP 121/80   Pulse 80   Temp 98.4 F (36.9 C)   Resp 16   Ht  (1.6 m)   Wt 200 lb 9.6 oz (91  kg)   LMP 03/04/2017   SpO2 98%   BMI 35.53 kg/m   HENT:  Head: Normocephalic and atraumatic.  Right Ear: Hearing normal.  Left Ear: Hearing normal.  Eyes: Conjunctivae are normal. No scleral icterus.  Neck: Normal range of motion. Neck supple. No thyromegaly present.  Cardiovascular: Normal rate, regular rhythm and normal heart sounds.   Pulses:      Radial pulses are 2+ on the right side, and 2+ on the left side.  Pulmonary/Chest: Effort normal and breath sounds normal.  Lymphadenopathy:       Head (right side): No tonsillar, no preauricular, no posterior auricular and no occipital adenopathy present.       Head (left side): No tonsillar, no preauricular, no posterior auricular and no occipital  adenopathy present.    She has no cervical adenopathy.       Right: No supraclavicular adenopathy present.       Left: No supraclavicular adenopathy present.  Neurological: She is alert and oriented to person, place, and time. No sensory deficit.  Skin: Skin is warm, dry and intact. No rash noted. No cyanosis or erythema. Nails show no clubbing.  Psychiatric: She has a normal mood and affect. Her speech is normal and behavior is normal.       Assessment & Plan:   Problem List Items Addressed This Visit    ADD (attention deficit disorder) - Primary    Resume Vyvanse 50 mg (she prefers the lower dose). May contact me in 3 months for prescriptions.      Relevant Medications   lisdexamfetamine (VYVANSE) 50 MG capsule   lisdexamfetamine (VYVANSE) 50 MG capsule   lisdexamfetamine (VYVANSE) 50 MG capsule    Other Visit Diagnoses    Change in bowel movement       Anticipate improvement with continued use of probiotic. If persists, would refer to GI.   Need for influenza vaccination       Relevant Orders   Flu Vaccine QUAD 36+ mos IM (Completed)   Need for Tdap vaccination       Relevant Orders   Tdap vaccine greater than or equal to 7yo IM (Completed)       Return in about 6 months (around 09/24/2017) for re-evaluation of ADD.   Fernande Bras, PA-C [facility] at [facility]

## 2017-03-29 ENCOUNTER — Telehealth: Payer: Self-pay

## 2017-03-29 NOTE — Telephone Encounter (Signed)
Call from patient requesting PA on vyvanse.  States she has been taking since 2007 and stopped one year to have a baby.  Other than that she has been on it the entire time.  Called [facility] and got approval from 03/29/17-03/29/2020.  Patient notified.  She was very Adult nurse.  She said she would notify the pharmacy.

## 2017-03-31 ENCOUNTER — Encounter: Payer: Self-pay | Admitting: Physician Assistant

## 2017-03-31 NOTE — Assessment & Plan Note (Signed)
Resume Vyvanse 50 mg (she prefers the lower dose). May contact me in 3 months for prescriptions.

## 2017-05-14 ENCOUNTER — Ambulatory Visit: Payer: BC Managed Care – PPO | Admitting: Physician Assistant

## 2017-05-14 ENCOUNTER — Encounter: Payer: Self-pay | Admitting: Physician Assistant

## 2017-05-14 VITALS — BP 126/90 | HR 90 | Temp 98.3°F | Resp 18 | Ht 63.0 in | Wt 185.0 lb

## 2017-05-14 DIAGNOSIS — R110 Nausea: Secondary | ICD-10-CM | POA: Diagnosis not present

## 2017-05-14 DIAGNOSIS — R0789 Other chest pain: Secondary | ICD-10-CM | POA: Diagnosis not present

## 2017-05-14 MED ORDER — MELOXICAM 15 MG PO TABS: 15.0000 mg | ORAL_TABLET | Freq: Every day | ORAL | 1 refills | Status: DC

## 2017-05-14 MED ORDER — CYCLOBENZAPRINE HCL 10 MG PO TABS: 10.0000 mg | ORAL_TABLET | Freq: Three times a day (TID) | ORAL | 0 refills | Status: DC | PRN

## 2017-05-14 MED ORDER — RANITIDINE HCL 150 MG PO TABS: 150.0000 mg | ORAL_TABLET | Freq: Two times a day (BID) | ORAL | 0 refills | Status: DC

## 2017-05-14 NOTE — Patient Instructions (Addendum)
The muscle relaxer (cyclobenzaprine) causes drowsiness, so you may need to reserve it for bedtime. Take the anti-inflammatory (meloxicam) with food.    IF you received an x-ray today, you will receive an invoice from [facility]. Please contact [facility] at [phone] with questions or concerns regarding your invoice.   IF you received labwork today, you will receive an invoice from [facility]. Please contact [facility] at [phone] with questions or concerns regarding your invoice.   Our billing staff will not be able to assist you with questions regarding bills from these companies.  You will be contacted with the lab results as soon as they are available. The fastest way to get your results is to activate your My Chart account. Instructions are located on the last page of this paperwork. If you have not heard from us regarding the results in 2 weeks, please contact this office.

## 2017-05-14 NOTE — Progress Notes (Signed)
Patient ID: Rhonda Tran, female    DOB: 10/09/1985, 31 y.o.   MRN: [id number]  PCP: Rhonda Tran, Rhonda Wimmer, PA-C  Chief Complaint  Patient presents with  . Back Pain    x4 months, left side including shoulder, ribs, and stops right before the butt. Pt states she has some burning and her husband founf some knots.  . Anxiety    GAD-7 score 11    Subjective:   Presents for evaluation of back pain x 4 months, which she thinks might be related to anxiety.  Burning pain, soreness. Starts in the upper back, along the shoulder blade, and extends down to the low back, just above the hip. Ibuprofen without benefit. Warm compress helped a little bit. Massage helps a little, but is also painful, so she hasn't been asking her husband to do it much.  LEFT hand dominant.  She doesn't really feel anxious, but is having symptoms that she thinks might be caused by anxiety. "Almost like my heart hurts." Chest feels tight. Doesn't feel worried. Sleeps well. Some heart racing, but only "a little bit." No change with deep breathing or LEFT arm movement.  Not nursing.  Daily headaches. Temples hurt. No associated vision changes. Contacts get dry. No vomiting. No photo-/phonophobia. Had some mild nausea before going back to work following maternity leave, about 1 month ago, and that was associated with anxiety.    Review of Systems As above.  GAD 7 : Generalized Anxiety Score 05/14/2017  Nervous, Anxious, on Edge 3  Control/stop worrying 1  Worry too much - different things 1  Trouble relaxing 2  Restless 0  Easily annoyed or irritable 2  Afraid - awful might happen 2  Total GAD 7 Score 11  Anxiety Difficulty Not difficult at all      Patient Active Problem List   Diagnosis Date Noted  . History of percutaneous transcatheter closure of congenital ASD 06/14/2012  . ADD (attention deficit disorder) 06/14/2012  . Migraine headache 06/14/2012     Prior to Admission  medications   Medication Sig Start Date End Date Taking? Authorizing Provider  lisdexamfetamine (VYVANSE) 50 MG capsule Take 1 capsule (50 mg total) by mouth daily. 03/27/17  Yes Marlow Berenguer, PA-C  lisdexamfetamine (VYVANSE) 50 MG capsule Take 1 capsule (50 mg total) by mouth daily. 03/27/17  Yes Rhonda Ramone, PA-C  lisdexamfetamine (VYVANSE) 50 MG capsule Take 1 capsule (50 mg total) by mouth daily. 03/27/17  Yes Rhonda Porta, PA-C  LO LOESTRIN FE 1 MG-10 MCG / 10 MCG tablet  02/21/17  Yes [provider]     No Known Allergies     Objective:  Physical Exam  Constitutional: She is oriented to person, place, and time. She appears well-developed and well-nourished. She is active and cooperative. No distress.  BP 126/90 (BP Location: Right Arm, Patient Position: Sitting, Cuff Size: Normal)   Pulse 90   Temp 98.3 F (36.8 C) (Oral)   Resp 18   Ht 5\' 3"  (1.6 m)   Wt 185 lb (83.9 kg)   LMP 04/12/2017   SpO2 98%   Breastfeeding? No   BMI 32.77 kg/m   HENT:  Head: Normocephalic and atraumatic.  Right Ear: Hearing normal.  Left Ear: Hearing normal.  Eyes: Conjunctivae are normal. No scleral icterus.  Neck: Normal range of motion. Neck supple. No thyromegaly present.  Cardiovascular: Normal rate, regular rhythm and normal heart sounds.  Pulses:      Radial pulses are  2+ on the right side, and 2+ on the left side.  Pulmonary/Chest: Effort normal and breath sounds normal. She exhibits tenderness. She exhibits no mass, no laceration, no crepitus, no edema, no deformity, no swelling and no retraction.    Musculoskeletal:       Cervical back: Normal.       Thoracic back: She exhibits tenderness and pain. She exhibits normal range of motion, no bony tenderness, no swelling, no edema, no deformity, no laceration, no spasm and normal pulse.       Lumbar back: She exhibits tenderness and pain. She exhibits normal range of motion, no bony tenderness, no swelling, no edema, no  deformity, no laceration, no spasm and normal pulse.       Arms: Lymphadenopathy:       Head (right side): No tonsillar, no preauricular, no posterior auricular and no occipital adenopathy present.       Head (left side): No tonsillar, no preauricular, no posterior auricular and no occipital adenopathy present.    She has no cervical adenopathy.       Right: No supraclavicular adenopathy present.       Left: No supraclavicular adenopathy present.  Neurological: She is alert and oriented to person, place, and time. She has normal strength. No cranial nerve deficit or sensory deficit.  Reflex Scores:      Bicep reflexes are 2+ on the right side and 2+ on the left side.      Patellar reflexes are 2+ on the right side and 2+ on the left side.      Achilles reflexes are 2+ on the right side and 2+ on the left side. Skin: Skin is warm, dry and intact. No rash noted. No cyanosis or erythema. Nails show no clubbing.  Psychiatric: She has a normal mood and affect. Her speech is normal and behavior is normal.           Assessment & Plan:   1. Chest wall pain NSAIDS and muscle relaxer. Suspect this is in part related to carrying her infant in a carrier on the dominant LEFT side. Heating pad. Gentle massage. Stretching. - meloxicam (MOBIC) 15 MG tablet; Take 1 tablet (15 mg total) daily by mouth.  Dispense: 30 tablet; Refill: 1 - cyclobenzaprine (FLEXERIL) 10 MG tablet; Take 1 tablet (10 mg total) 3 (three) times daily as needed by mouth for muscle spasms.  Dispense: 30 tablet; Refill: 0  2. Nausea without vomiting Associated with anxiety, may worsen with NSAID. H2 blocker. - ranitidine (ZANTAC) 150 MG tablet; Take 1 tablet (150 mg total) 2 (two) times daily by mouth.  Dispense: 60 tablet; Refill: 0    Return in about 2 weeks (around 05/28/2017) for re-evaluation of chest wall pain and nausea, anxiety, unless symptoms resolve.   Rhonda Brashelle S. Kaine Mcquillen, PA-C Primary Care at [facility]

## 2017-05-28 ENCOUNTER — Ambulatory Visit: Payer: BC Managed Care – PPO | Admitting: Physician Assistant

## 2017-06-20 ENCOUNTER — Other Ambulatory Visit: Payer: Self-pay | Admitting: Physician Assistant

## 2017-06-20 DIAGNOSIS — F988 Other specified behavioral and emotional disorders with onset usually occurring in childhood and adolescence: Secondary | ICD-10-CM

## 2017-06-24 ENCOUNTER — Telehealth: Payer: Self-pay | Admitting: Physician Assistant

## 2017-06-24 DIAGNOSIS — F988 Other specified behavioral and emotional disorders with onset usually occurring in childhood and adolescence: Secondary | ICD-10-CM

## 2017-06-24 NOTE — Telephone Encounter (Signed)
vyvanse prescription   Prescription  Request

## 2017-06-24 NOTE — Telephone Encounter (Signed)
Pt. Calling to check on status of Vyvanse refill. Pt. Only has two pills left.  Please call pt. When RX has been sent to pharmacy.

## 2017-06-24 NOTE — Telephone Encounter (Signed)
Copied from CRM [phone]. Topic: Inquiry >> Jun 24, 2017  2:39 PM Raquel SarnaHayes, Teresa G wrote: Pt is requesting to have Vyvanse 50mg  filled.  She says she left a message last week to have the Rx filled.  Wants to know when she can come by to the the written copy for her to take to her pharmacy.  She has 2 pills left.

## 2017-06-25 MED ORDER — LISDEXAMFETAMINE DIMESYLATE 50 MG PO CAPS
50.0000 mg | ORAL_CAPSULE | Freq: Every day | ORAL | 0 refills | Status: DC
Start: 2017-06-25 — End: 2017-09-25

## 2017-06-25 MED ORDER — LISDEXAMFETAMINE DIMESYLATE 50 MG PO CAPS: 50.0000 mg | ORAL_CAPSULE | Freq: Every day | ORAL | 0 refills | Status: DC

## 2017-06-25 NOTE — Telephone Encounter (Signed)
Pt called about rx, she only has 1 pill left and needs to pick up today. cb number is [phone].

## 2017-06-25 NOTE — Telephone Encounter (Signed)
Patient notified via My Chart.  Rx sent electronically.  Meds ordered this encounter  Medications  . lisdexamfetamine (VYVANSE) 50 MG capsule    Sig: Take 1 capsule (50 mg total) by mouth daily.    Dispense:  30 capsule    Refill:  0    Order Specific Question:   Supervising Provider    Answer:   SHAW, EVA N [[id number]]  . lisdexamfetamine (VYVANSE) 50 MG capsule    Sig: Take 1 capsule (50 mg total) by mouth daily.    Dispense:  30 capsule    Refill:  0    May fill 30 days after date on prescription    Order Specific Question:   Supervising Provider    Answer:   Clelia CroftSHAW, EVA N [[id number]]  . lisdexamfetamine (VYVANSE) 50 MG capsule    Sig: Take 1 capsule (50 mg total) by mouth daily.    Dispense:  30 capsule    Refill:  0    May fill 60 days after date on prescription    Order Specific Question:   Supervising Provider    Answer:   Clelia CroftSHAW, EVA N [[id number]]

## 2017-08-14 ENCOUNTER — Telehealth: Payer: Self-pay | Admitting: Physician Assistant

## 2017-08-14 NOTE — Telephone Encounter (Signed)
Called pt to try and reschedule her apopt with Porfirio Oarhelle Jeffery on 09/27/17. Chelle will not be in the office that day. When pt calls back in, please reschedule them for any other day.  Thanks!

## 2017-09-25 ENCOUNTER — Encounter: Payer: Self-pay | Admitting: Physician Assistant

## 2017-09-25 ENCOUNTER — Ambulatory Visit: Payer: BC Managed Care – PPO | Admitting: Physician Assistant

## 2017-09-25 ENCOUNTER — Other Ambulatory Visit: Payer: Self-pay

## 2017-09-25 VITALS — BP 116/78 | HR 102 | Temp 97.8°F | Resp 16 | Ht 63.0 in | Wt 175.2 lb

## 2017-09-25 DIAGNOSIS — F988 Other specified behavioral and emotional disorders with onset usually occurring in childhood and adolescence: Secondary | ICD-10-CM | POA: Diagnosis not present

## 2017-09-25 MED ORDER — LISDEXAMFETAMINE DIMESYLATE 70 MG PO CAPS: 70.0000 mg | ORAL_CAPSULE | Freq: Every day | ORAL | 0 refills | Status: DC

## 2017-09-25 NOTE — Patient Instructions (Signed)
     IF you received an x-ray today, you will receive an invoice from [facility]. Please contact [facility] at [phone] with questions or concerns regarding your invoice.   IF you received labwork today, you will receive an invoice from [facility]. Please contact [facility] at [phone] with questions or concerns regarding your invoice.   Our billing staff will not be able to assist you with questions regarding bills from these companies.  You will be contacted with the lab results as soon as they are available. The fastest way to get your results is to activate your My Chart account. Instructions are located on the last page of this paperwork. If you have not heard from us regarding the results in 2 weeks, please contact this office.     

## 2017-09-25 NOTE — Progress Notes (Signed)
Subjective:    Patient ID: Rhonda Tran, female    DOB: 10/16/1985, 32 y.o.   MRN: [id number]   No chief complaint on file.   HPI Patient presents for re-evaluation of ADD. Her last ADD check up was on 03/27/2017 at which time she started back on Vyvanse (which she stopped during her pregnancy.) Per the note on 03/27/2017, patient had been on Vyvanse since 2007 up until her pregnancy and done well on it.  Patient states that she crashes around 3 o'clock in the afternoon. Patient takes her Vyvanse at 6:15 in the morning. 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 She notes that when she takes it at a later time on weekends, the 'crash' is delayed to later than 3 pm. Patient does not recall this crash on the 70 mg dose that she took prior to her son's pregnancy.   Patient states that there are no changes in family history in terms of cardiovascular disease. Patient states that she sleeps well at night and has a healthy appetite.   Patient Active Problem List   Diagnosis Date Noted  . History of percutaneous transcatheter closure of congenital ASD 06/14/2012  . ADD (attention deficit disorder) 06/14/2012  . Migraine headache 06/14/2012   No Known Allergies   Prior to Admission medications   Medication Sig Start Date End Date Taking? Authorizing Provider  cyclobenzaprine (FLEXERIL) 10 MG tablet Take 1 tablet (10 mg total) 3 (three) times daily as needed by mouth for muscle spasms. 05/14/17   Porfirio OarJeffery, Chelle, PA-C  lisdexamfetamine (VYVANSE) 50 MG capsule Take 1 capsule (50 mg total) by mouth daily. 06/25/17   Porfirio OarJeffery, Chelle, PA-C  lisdexamfetamine (VYVANSE) 50 MG capsule Take 1 capsule (50 mg total) by mouth daily. 06/25/17   Porfirio OarJeffery, Chelle, PA-C  lisdexamfetamine (VYVANSE) 50 MG capsule Take 1 capsule (50 mg total) by mouth daily. 06/25/17   Jeffery, Chelle, PA-C  LO  LOESTRIN FE 1 MG-10 MCG / 10 MCG tablet  02/21/17   [provider]  meloxicam (MOBIC) 15 MG tablet Take 1 tablet (15 mg total) daily by mouth. 05/14/17   Porfirio OarJeffery, Chelle, PA-C  Prenatal Vit-Fe Fumarate-FA (PRENATAL MULTIVITAMIN) TABS tablet Take 1 tablet by mouth daily at 12 noon.    [provider]  Probiotic Product (PROBIOTIC DAILY PO) Take 1 tablet by mouth daily.    [provider]  ranitidine (ZANTAC) 150 MG tablet Take 1 tablet (150 mg total) 2 (two) times daily by mouth. 05/14/17   Porfirio OarJeffery, Chelle, PA-C    Past Medical History:  Diagnosis Date  . First degree perineal laceration during delivery 01/12/2017  . Gestational HTN 2018   son delivered 01/12/2017  . Maternal anemia, with delivery 01/13/2017  . 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 Marital status: Married    Spouse name: Jilda PandaJared  . Number of children: 1  . Years of education: Master's Degree  . Highest education level: Not on file  Social Needs  . Financial resource strain: Not on file  . Food insecurity - worry: Not on file  . Food insecurity - inability: Not on file  . Transportation needs - medical: Not on file  . Transportation needs - non-medical: Not on file  Occupational History  .  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 Smoking status: Never Smoker  . Smokeless tobacco: Never Used  Substance and Sexual Activity  . Alcohol use: No    Alcohol/week: 0.0 oz  . Drug use: No  . Sexual activity: No    Birth control/protection: Abstinence  Other Topics Concern  . Not on file  Social History Narrative   Lives with her husband, their son (born 01/2017) and their dog.   Family lives nearby.   Mother cares for her son while she is working.   Family History  Problem Relation Age of Onset  . Cancer Maternal Grandmother   . Cancer  Maternal Grandfather    Past Surgical History:  Procedure Laterality Date  . CARDIAC SURGERY      Review of Systems  Constitutional: Positive for fatigue. Negative for activity change, appetite change, diaphoresis and unexpected weight change.  Respiratory: Negative for chest tightness and shortness of breath.   Cardiovascular: Negative for chest pain and palpitations.  Gastrointestinal: Negative.  Negative for abdominal distention, abdominal pain, constipation, diarrhea, nausea and vomiting.  Endocrine: Negative for polydipsia and polyuria.  Genitourinary: Negative for difficulty urinating, dysuria and frequency.  Neurological: Positive for headaches. Negative for dizziness, light-headedness and numbness.  Psychiatric/Behavioral: Negative for sleep disturbance. The patient is not nervous/anxious.        Objective:   Physical Exam  Constitutional: She is oriented to person, place, and time. She appears well-developed and well-nourished.  BP 116/78   Pulse (!) 102   Temp 97.8 F (36.6 C)   Resp 16   Ht 5\' 3"  (1.6 m)   Wt 175 lb 3.2 oz (79.5 kg)   SpO2 97%   BMI 31.04 kg/m   HENT:  Head: Normocephalic and atraumatic.  Right Ear: External ear normal.  Left Ear: External ear normal.  Nose: Nose normal.  Mouth/Throat: Oropharynx is clear and moist.  Eyes: Conjunctivae and EOM are normal. Pupils are equal, round, and reactive to light.  Neck: Normal range of motion. Neck supple.  Cardiovascular: Normal rate, regular rhythm, normal heart sounds and intact distal pulses. Exam reveals no gallop and no friction rub.  No murmur heard. Pulmonary/Chest: Effort normal and breath sounds normal.  Musculoskeletal: Normal range of motion.  Neurological: She is alert and oriented to person, place, and time. She has normal reflexes.  Skin: Skin is warm and dry.  Psychiatric: She has a normal mood and affect. Her behavior is normal. Judgment and thought content normal.      Assessment &  Plan:

## 2017-09-25 NOTE — Progress Notes (Signed)
Patient ID: Rhonda Tran, female    DOB: 08/21/1985, 32 y.o.   MRN: [id number]  PCP: Porfirio OarJeffery, Harm Jou, PA-C  Chief Complaint  Patient presents with  . Follow-up    ADD    Subjective:   Presents for evaluation of attention deficit disorder.  6 months ago she restarted Vyvanse, having stopped during pregnancy. Recall she had taken Vyvanse beginning in 2007 and had done quite well.  She takes the dose of Vyvanse at approximately 6:15 AM and notes a "crash" around 3:00 in the afternoon.  This gets her through the school day, which ends at 2:30 PM, but does not allow her to complete her responsibilities at home.  On the weekends, she takes her morning dose later and the "crash" is delayed.  She recalls that when she took 70 mg, prior to her pregnancy, she did not experience the sudden end of symptom control.  No heart palpitations, shortness of breath.  Sleeps well.  Good appetite.    Review of Systems Constitutional: Positive for fatigue. Negative for activity change, appetite change, diaphoresis and unexpected weight change.  Respiratory: Negative for chest tightness and shortness of breath.   Cardiovascular: Negative for chest pain and palpitations.  Gastrointestinal: Negative.  Negative for abdominal distention, abdominal pain, constipation, diarrhea, nausea and vomiting.  Endocrine: Negative for polydipsia and polyuria.  Genitourinary: Negative for difficulty urinating, dysuria and frequency.  Neurological: Positive for headaches. Negative for dizziness, light-headedness and numbness.  Psychiatric/Behavioral: Negative for sleep disturbance. The patient is not nervous/anxious.         Patient Active Problem List   Diagnosis Date Noted  . History of percutaneous transcatheter closure of congenital ASD 06/14/2012  . ADD (attention deficit disorder) 06/14/2012     Prior to Admission medications   Medication Sig Start Date End Date Taking? Authorizing Provider    lisdexamfetamine (VYVANSE) 50 MG capsule Take 1 capsule (50 mg total) by mouth daily. 06/25/17  Yes Satina Jerrell, PA-C  lisdexamfetamine (VYVANSE) 50 MG capsule Take 1 capsule (50 mg total) by mouth daily. 06/25/17  Yes Nansi Birmingham, PA-C  lisdexamfetamine (VYVANSE) 50 MG capsule Take 1 capsule (50 mg total) by mouth daily. 06/25/17  Yes Yulian Gosney, PA-C  LO LOESTRIN FE 1 MG-10 MCG / 10 MCG tablet  02/21/17  Yes [provider]     No Known Allergies     Objective:  Physical Exam  Constitutional: She is oriented to person, place, and time. She appears well-developed and well-nourished. She is active and cooperative. No distress.  BP 116/78   Pulse (!) 102   Temp 97.8 F (36.6 C)   Resp 16   Ht 5\' 3"  (1.6 m)   Wt 175 lb 3.2 oz (79.5 kg)   SpO2 97%   BMI 31.04 kg/m    Eyes: Conjunctivae are normal.  Pulmonary/Chest: Effort normal.  Neurological: She is alert and oriented to person, place, and time.  Psychiatric: She has a normal mood and affect. Her speech is normal and behavior is normal.    Wt Readings from Last 3 Encounters:  09/25/17 175 lb 3.2 oz (79.5 kg)  05/14/17 185 lb (83.9 kg)  03/27/17 200 lb 9.6 oz (91 kg)       Assessment & Plan:   Problem List Items Addressed This Visit    ADD (attention deficit disorder) - Primary    Increase Vyvanse from 50 mg to 70 mg daily in hopes of eliminating sudden loss of symptom control.  Relevant Medications   lisdexamfetamine (VYVANSE) 70 MG capsule   lisdexamfetamine (VYVANSE) 70 MG capsule   lisdexamfetamine (VYVANSE) 70 MG capsule       Return in about 3 months (around 12/26/2017) for re-evalaution of attention.   Fernande Bras, PA-C Primary Care at [facility]

## 2017-09-27 ENCOUNTER — Ambulatory Visit: Payer: BC Managed Care – PPO | Admitting: Physician Assistant

## 2017-09-27 ENCOUNTER — Telehealth: Payer: Self-pay | Admitting: Physician Assistant

## 2017-09-27 NOTE — Telephone Encounter (Signed)
Copied from CRM [phone]. Topic: Quick Communication - See Telephone Encounter >> Sep 27, 2017  9:18 AM Eston Mouldavis, Darcey Demma B wrote: CRM for notification. See Telephone encounter for: 09/27/17. Pt called about PA for lisdexamfetamine (VYVANSE) 70 MG capsule,  she states  she has contacted pharmacy [facility] - [place] - [address] [address] [phone] (Phone) [phone] (Fax)     and she also has spoken with insurance and they say they are waiting on form from office .  Pt also states she is out of medication.

## 2017-09-27 NOTE — Telephone Encounter (Signed)
Patient is calling and states she came on Wednesday 3/20. She states she is out and it is the weekend. She is very upset. I informed the patient PA was in process. Please advise.

## 2017-09-27 NOTE — Telephone Encounter (Signed)
Patient is calling back and states she has called CoverMyMeds and states they are waiting on the office. Please advise.

## 2017-09-27 NOTE — Telephone Encounter (Signed)
Information has been submitted to [employer]. No update yet.   If [employer] has not responded to your request within 24 hours, contact [employer] at [phone].

## 2017-09-27 NOTE — Telephone Encounter (Signed)
PA has been started for Vyvanse Awaiting Response    Your demographic data has been sent to the plan successfully. They will respond with your clinical questions and you will be notified by email when available within the next business day. You can also check for an update later by opening this request from your dashboard. Please do not fax or call the plan to resubmit this request. If you need assistance, please chat with CoverMyMeds or call us at [phone].

## 2017-10-01 NOTE — Telephone Encounter (Signed)
Called approved on 3/22 and picked up

## 2017-10-10 ENCOUNTER — Encounter: Payer: Self-pay | Admitting: Physician Assistant

## 2017-11-04 NOTE — Assessment & Plan Note (Signed)
Increase Vyvanse from 50 mg to 70 mg daily in hopes of eliminating sudden loss of symptom control.

## 2017-12-10 ENCOUNTER — Ambulatory Visit: Payer: BC Managed Care – PPO | Admitting: Physician Assistant

## 2017-12-10 ENCOUNTER — Encounter: Payer: Self-pay | Admitting: Physician Assistant

## 2017-12-10 ENCOUNTER — Other Ambulatory Visit: Payer: Self-pay

## 2017-12-10 VITALS — BP 106/78 | HR 106 | Temp 97.8°F | Resp 16 | Ht 62.68 in | Wt 174.4 lb

## 2017-12-10 DIAGNOSIS — R519 Headache, unspecified: Secondary | ICD-10-CM

## 2017-12-10 DIAGNOSIS — R51 Headache: Secondary | ICD-10-CM | POA: Diagnosis not present

## 2017-12-10 DIAGNOSIS — F988 Other specified behavioral and emotional disorders with onset usually occurring in childhood and adolescence: Secondary | ICD-10-CM | POA: Diagnosis not present

## 2017-12-10 MED ORDER — LISDEXAMFETAMINE DIMESYLATE 70 MG PO CAPS: 70.0000 mg | ORAL_CAPSULE | Freq: Every day | ORAL | 0 refills | Status: DC

## 2017-12-10 NOTE — Assessment & Plan Note (Signed)
Well controlled. Continue current treatment. 

## 2017-12-10 NOTE — Patient Instructions (Addendum)
Please schedule with your eye specialist. If they identify another cause for the black spots in your vision, it's ok to cancel the neurology appointment.    IF you received an x-ray today, you will receive an invoice from [facility]. Please contact [facility] at [phone] with questions or concerns regarding your invoice.   IF you received labwork today, you will receive an invoice from [facility]. Please contact [facility] at [phone] with questions or concerns regarding your invoice.   Our billing staff will not be able to assist you with questions regarding bills from these companies.  You will be contacted with the lab results as soon as they are available. The fastest way to get your results is to activate your My Chart account. Instructions are located on the last page of this paperwork. If you have not heard from us regarding the results in 2 weeks, please contact this office.

## 2017-12-10 NOTE — Progress Notes (Signed)
Patient ID: Rhonda Tran, female    DOB: [birth year], 32 y.o.   MRN: [id number]  PCP: Porfirio Oar, PA-C  Chief Complaint  Patient presents with  . ADD    medication follow up     Subjective:   Presents for evaluation of ADD.  At her visit in March, we increased Vyvanse dose to 70 mg, in hopes of preventing the "crash" she was experiencing when the 50 mg dose wore off. She reports that the dose change is working well for her symptoms, and she no longer has the "crash."  No associated heart palpitations, chest pain, SOB, sleep disturbance. No blurred vision, but notes black spots in periphery, then develps headache along the LEFT brow. Had these prior to pregnancy and while pregnant. Used to resolve with 400 mg ibuprofen. 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 History of syncope. Associated with same black spots, also ears feel clogged. "Comes from heat." Mother has migraine headache.   Review of Systems As above    Patient Active Problem List   Diagnosis Date Noted  . History of percutaneous transcatheter closure of congenital ASD 06/14/2012  . ADD (attention deficit disorder) 06/14/2012     Prior to Admission medications   Medication Sig Start Date End Date Taking? Authorizing Provider  lisdexamfetamine (VYVANSE) 70 MG capsule Take 1 capsule (70 mg total) by mouth daily. 09/25/17  Yes Greysin Medlen, PA-C  LO LOESTRIN FE 1 MG-10 MCG / 10 MCG tablet  02/21/17  Yes [provider]  lisdexamfetamine (VYVANSE) 70 MG capsule Take 1 capsule (70 mg total) by mouth daily. Patient not taking: Reported on 12/10/2017 09/25/17   Porfirio Oar, PA-C  lisdexamfetamine (VYVANSE) 70 MG capsule Take 1 capsule (70 mg total) by mouth daily. Patient not taking: Reported on 12/10/2017 09/25/17   Porfirio Oar, PA-C     No Known Allergies     Objective:  Physical Exam  Constitutional: She is oriented to person, place, and time. She  appears well-developed and well-nourished. She is active and cooperative. No distress.  BP 106/78   Pulse (!) 106   Temp 97.8 F (36.6 C)   Resp 16   Ht 5' 2.68" (1.592 m)   Wt 174 lb 6.4 oz (79.1 kg)   SpO2 98%   BMI 31.21 kg/m   HENT:  Head: Normocephalic and atraumatic.  Right Ear: Hearing normal.  Left Ear: Hearing normal.  Eyes: Conjunctivae are normal. No scleral icterus.  Neck: Normal range of motion. Neck supple. No thyromegaly present.  Cardiovascular: Normal rate, regular rhythm and normal heart sounds.  Pulses:      Radial pulses are 2+ on the right side, and 2+ on the left side.  Pulmonary/Chest: Effort normal and breath sounds normal.  Lymphadenopathy:       Head (right side): No tonsillar, no preauricular, no posterior auricular and no occipital adenopathy present.       Head (left side): No tonsillar, no preauricular, no posterior auricular and no occipital adenopathy present.    She has no cervical adenopathy.       Right: No supraclavicular adenopathy present.       Left: No supraclavicular adenopathy present.  Neurological: She is alert and oriented to person, place, and time. No sensory deficit.  Skin: Skin is warm, dry and intact. No rash noted. No cyanosis or erythema. Nails show no clubbing.  Psychiatric: She has a normal mood and affect. Her speech is normal  and behavior is normal.   Wt Readings from Last 3 Encounters:  12/10/17 174 lb 6.4 oz (79.1 kg)  09/25/17 175 lb 3.2 oz (79.5 kg)  05/14/17 185 lb (83.9 kg)       Assessment & Plan:   Problem List Items Addressed This Visit    ADD (attention deficit disorder) - Primary    Well controlled. Continue current treatment.      Relevant Medications   lisdexamfetamine (VYVANSE) 70 MG capsule   lisdexamfetamine (VYVANSE) 70 MG capsule   lisdexamfetamine (VYVANSE) 70 MG capsule    Other Visit Diagnoses    Nonintractable episodic headache, unspecified headache type       Suspect migraine with aura.  history of congenital ASD s/p percutaneous closure. If migraine with aura, needs alternative contraception.   Relevant Orders   Ambulatory referral to Neurology       Return in about 3 months (around 03/12/2018) for re-evalaution of ADD with PA Barnett AbuWiseman or Dr. Leretha PolSantiago.   Fernande Brashelle S. Azariyah Luhrs, PA-C [facility] at [facility]

## 2017-12-12 ENCOUNTER — Ambulatory Visit: Payer: BC Managed Care – PPO | Admitting: Physician Assistant

## 2017-12-12 ENCOUNTER — Other Ambulatory Visit: Payer: Self-pay

## 2017-12-12 ENCOUNTER — Ambulatory Visit: Payer: Self-pay | Admitting: *Deleted

## 2017-12-12 ENCOUNTER — Encounter: Payer: Self-pay | Admitting: Physician Assistant

## 2017-12-12 VITALS — BP 120/82 | HR 97 | Temp 98.9°F | Resp 16 | Ht 62.25 in | Wt 175.2 lb

## 2017-12-12 DIAGNOSIS — R200 Anesthesia of skin: Secondary | ICD-10-CM

## 2017-12-12 DIAGNOSIS — R202 Paresthesia of skin: Secondary | ICD-10-CM

## 2017-12-12 NOTE — Progress Notes (Signed)
Rhonda MountsKristin Tran Tran  MRN: [id number] DOB: 04/03/1986  PCP: Rhonda RiverWiseman, Brittany D, PA-C  Subjective:  Pt is a pleasant 32 year old female who presents to clinic for left arm tingling x 2 days. She is here with her husband and infant.  She endorses tingling is of her left bicep which started earlier today. "feels like squeezing".  Sensation is not present currently. The back of her bicep was warm earlier this afternoon. Denies swelling or pain. Abnormal sensation is localized to her bicep - does not radiate.  She endorses tingling of her left side underneath her armpit, which has been going on for several weeks now.  She believes this may be related to anxiety. 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  She attended a funeral service earlier today Of note, pt has h/o atrial septal repair when she was 32 years old (per pt)  She was evaluated for chest wall pain on 05/2017 2/2 carrying infant on left side.   Review of Systems  Constitutional: Negative for diaphoresis and fatigue.  Musculoskeletal: Positive for arthralgias (left bicep tingling).  Skin: Negative.   Psychiatric/Behavioral: Negative for dysphoric mood, self-injury and suicidal ideas. The patient is nervous/anxious.     Patient Active Problem List   Diagnosis Date Noted  . History of percutaneous transcatheter closure of congenital ASD 06/14/2012  . ADD (attention deficit disorder) 06/14/2012    Current Outpatient Medications on File Prior to Visit  Medication Sig Dispense Refill  . lisdexamfetamine (VYVANSE) 70 MG capsule Take 1 capsule (70 mg total) by mouth daily. 30 capsule 0  . lisdexamfetamine (VYVANSE) 70 MG capsule Take 1 capsule (70 mg total) by mouth daily. 30 capsule 0  . lisdexamfetamine (VYVANSE) 70 MG capsule Take 1 capsule (70 mg total) by mouth daily. 30 capsule 0  . LO LOESTRIN FE 1 MG-10 MCG / 10 MCG tablet      No current facility-administered medications on file prior to visit.     No  Known Allergies   Objective:  BP 120/82 (BP Location: Right Arm)   Pulse 97   Temp 98.9 F (37.2 C) (Oral)   Resp 16   Ht 5' 2.25" (1.581 m)   Wt 175 lb 3.2 oz (79.5 kg)   LMP 12/02/2017   SpO2 100%   BMI 31.79 kg/m   Physical Exam  Constitutional: She is oriented to person, place, and time. No distress.  Cardiovascular: Normal rate, regular rhythm and normal heart sounds.  Neurological: She is alert and oriented to person, place, and time. She has normal strength. No sensory deficit.  Skin: Skin is warm and dry.  Chest, neck and b/l biceps flushed and warm during exam. Flushing resolved and returned multiple times.   Psychiatric: Judgment normal.  Vitals reviewed.  EKG - normal sinus rhythm. No ST abnormalities.  Assessment and Plan :  1. Numbness and tingling in left arm -Patient presents with tingling of left bicep x 1 day.  Her EKG is not concerning. Low suspicion for radiculopathy.  She does have a history of intermittent anxiety, which I believe is attributing to today's symptoms 2/2 increased recent life stressors including new baby and her work as a Runner, broadcasting/film/videoteacher.  Discussed with patient need to develop stress relieving techniques.  She understands and agrees.  Strict precautions to return to clinic if symptoms worsen. - EKG 12-Lead   Marco CollieWhitney Marilin Kofman, PA-C  [facility] at [facility] 12/12/2017 5:33 PM

## 2017-12-12 NOTE — Telephone Encounter (Signed)
Patient has symptoms of pressure in her l arm that she states she noticed this am.Recently seen by Theora Gianottihelle Jeffrey for headache . She reports some slight tingling sensation in the fingers of her hand . She denies any chest pain.Speech is  Intact. Denies any injury.She reports her arm feels warm. Appointment made for today with [facility].   Reason for Disposition . Numbness (i.e., loss of sensation) in hand or fingers  Answer Assessment - Initial Assessment Questions 1. ONSET: "When did the pain start?"      8 am   2. LOCATION: "Where is the pain located?"      L arm 3. PAIN: "How bad is the pain?" (Scale 1-10; or mild, moderate, severe)   - MILD (1-3): doesn't interfere with normal activities   - MODERATE (4-7): interferes with normal activities (e.g., work or school) or awakens from sleep   - SEVERE (8-10): excruciating pain, unable to do any normal activities, unable to hold a cup of water       Mod  Pressure  4. WORK OR EXERCISE: "Has there been any recent work or exercise that involved this part of the body?"       NO  5. CAUSE: "What do you think is causing the arm pain?"       NOT   6. OTHER SYMPTOMS: "Do you have any other symptoms?" (e.g., neck pain, swelling, rash, fever, numbness, weakness)     Arm is warm  To touch   7. PREGNANCY: "Is there any chance you are pregnant?" "When was your last menstrual period?"      3  WEEKS  AGO  Protocols used: ARM PAIN-A-AH

## 2017-12-12 NOTE — Patient Instructions (Addendum)
Your EKG looks great! I have a low suspicion this is due to a nerve problem.  I suspect this is possibly increased anxiety.  Please follow-up with Chelle to discuss possible treatment options, if you would like. See below for stress-relieving techniques.  Come back if your symptoms worsen or fail to improve.    Consider practicing mindfulness meditation or other relaxation techniques such as deep breathing, prayer, yoga, tai chi, massage. See website mindful.org or the apps Headspace or Calm to help get started.   Living With Anxiety After being diagnosed with an anxiety disorder, you may be relieved to know why you have felt or behaved a certain way. It is natural to also feel overwhelmed about the treatment ahead and what it will mean for your life. With care and support, you can manage this condition and recover from it. How to cope with anxiety Dealing with stress Stress is your body's reaction to life changes and events, both good and bad. Stress can last just a few hours or it can be ongoing. Stress can play a major role in anxiety, so it is important to learn both how to cope with stress and how to think about it differently. Talk with your health care provider or a counselor to learn more about stress reduction. He or she may suggest some stress reduction techniques, such as:  Music therapy. This can include creating or listening to music that you enjoy and that inspires you.  Mindfulness-based meditation. This involves being aware of your normal breaths, rather than trying to control your breathing. It can be done while sitting or walking.  Centering prayer. This is a kind of meditation that involves focusing on a word, phrase, or sacred image that is meaningful to you and that brings you peace.  Deep breathing. To do this, expand your stomach and inhale slowly through your nose. Hold your breath for 3-5 seconds. Then exhale slowly, allowing your stomach muscles to  relax.  Self-talk. This is a skill where you identify thought patterns that lead to anxiety reactions and correct those thoughts.  Muscle relaxation. This involves tensing muscles then relaxing them.  Choose a stress reduction technique that fits your lifestyle and personality. Stress reduction techniques take time and practice. Set aside 5-15 minutes a day to do them. Therapists can offer training in these techniques. The training may be covered by some insurance plans. Other things you can do to manage stress include:  Keeping a stress diary. This can help you learn what triggers your stress and ways to control your response.  Thinking about how you respond to certain situations. You may not be able to control everything, but you can control your reaction.  Making time for activities that help you relax, and not feeling guilty about spending your time in this way.  Therapy combined with coping and stress-reduction skills provides the best chance for successful treatment. Medicines Medicines can help ease symptoms. Medicines for anxiety include:  Anti-anxiety drugs.  Antidepressants.  Beta-blockers.  Medicines may be used as the main treatment for anxiety disorder, along with therapy, or if other treatments are not working. Medicines should be prescribed by a health care provider. Relationships Relationships can play a big part in helping you recover. Try to spend more time connecting with trusted friends and family members. Consider going to couples counseling, taking family education classes, or going to family therapy. Therapy can help you and others better understand the condition. How to recognize changes in your condition  Everyone has a different response to treatment for anxiety. Recovery from anxiety happens when symptoms decrease and stop interfering with your daily activities at home or work. This may mean that you will start to:  Have better concentration and focus.  Sleep  better.  Be less irritable.  Have more energy.  Have improved memory.  It is important to recognize when your condition is getting worse. Contact your health care provider if your symptoms interfere with home or work and you do not feel like your condition is improving. Where to find help and support: You can get help and support from these sources:  Self-help groups.  Online and [employer].  A trusted spiritual leader.  Couples counseling.  Family education classes.  Family therapy.  Follow these instructions at home:  Eat a healthy diet that includes plenty of vegetables, fruits, whole grains, low-fat dairy products, and lean protein. Do not eat a lot of foods that are high in solid fats, added sugars, or salt.  Exercise. Most adults should do the following: ? Exercise for at least 150 minutes each week. The exercise should increase your heart rate and make you sweat (moderate-intensity exercise). ? Strengthening exercises at least twice a week.  Cut down on caffeine, tobacco, alcohol, and other potentially harmful substances.  Get the right amount and quality of sleep. Most adults need 7-9 hours of sleep each night.  Make choices that simplify your life.  Take over-the-counter and prescription medicines only as told by your health care provider.  Avoid caffeine, alcohol, and certain over-the-counter cold medicines. These may make you feel worse. Ask your pharmacist which medicines to avoid.  Keep all follow-up visits as told by your health care provider. This is important. Questions to ask your health care provider  Would I benefit from therapy?  How often should I follow up with a health care provider?  How long do I need to take medicine?  Are there any long-term side effects of my medicine?  Are there any alternatives to taking medicine? Contact a health care provider if:  You have a hard time staying focused or finishing daily tasks.  You  spend many hours a day feeling worried about everyday life.  You become exhausted by worry.  You start to have headaches, feel tense, or have nausea.  You urinate more than normal.  You have diarrhea. Get help right away if:  You have a racing heart and shortness of breath.  You have thoughts of hurting yourself or others. If you ever feel like you may hurt yourself or others, or have thoughts about taking your own life, get help right away. You can go to your nearest emergency department or call:  Your local emergency services (911 in the [place]  A suicide crisis helpline, such as the [place] at 6822538859. This is open 24-hours a day.  Summary  Taking steps to deal with stress can help calm you.  Medicines cannot cure anxiety disorders, but they can help ease symptoms.  Family, friends, and partners can play a big part in helping you recover from an anxiety disorder. This information is not intended to replace advice given to you by your health care provider. Make sure you discuss any questions you have with your health care provider. Document Released: 06/19/2016 Document Revised: 06/19/2016 Document Reviewed: 06/19/2016 Elsevier Interactive Patient Education  2018 [employer].   IF you received an x-ray today, you will receive an invoice from [facility]. Please  contact [facility] at [phone] with questions or concerns regarding your invoice.   IF you received labwork today, you will receive an invoice from [facility]. Please contact [facility] at [phone] with questions or concerns regarding your invoice.   Our billing staff will not be able to assist you with questions regarding bills from these companies.  You will be contacted with the lab results as soon as they are available. The fastest way to get your results is to activate your My Chart account. Instructions are located on the last page of this paperwork. If  you have not heard from [place] regarding the results in 2 weeks, please contact this office.

## 2017-12-13 ENCOUNTER — Encounter: Payer: Self-pay | Admitting: Neurology

## 2018-02-18 ENCOUNTER — Telehealth: Payer: Self-pay | Admitting: Physician Assistant

## 2018-02-18 NOTE — Telephone Encounter (Signed)
Called pt to reschedule visit on 03/13/18. Rescheduled

## 2018-02-25 ENCOUNTER — Ambulatory Visit: Payer: BC Managed Care – PPO | Admitting: Neurology

## 2018-03-10 NOTE — Progress Notes (Signed)
   Rhonda Tran  MRN: [id number] DOB: 05-09-1986  Subjective:  Rhonda Tran is a 32 y.o. female seen in office today for a chief complaint of f/u on ADD.Dx in college, which is when she was started on medication. Tried adderall first, but did not like the way she felt on it. Currently managed on Vyvanse 70mg  daily, which works well for her. Takes it daily. Denies heart palpitations, decreased appetite, insomnia, N/V/D.Denies illicit drug use.   Review of Systems  Constitutional: Negative for chills, diaphoresis and fever.  Musculoskeletal: Positive for back pain (will occasionally have low back pain and muscle spasms, improving since not lifitng her baby as much).  Neurological: Negative for dizziness and light-headedness.    Patient Active Problem List   Diagnosis Date Noted  . History of percutaneous transcatheter closure of congenital ASD 06/14/2012  . ADD (attention deficit disorder) 06/14/2012    Current Outpatient Medications on File Prior to Visit  Medication Sig Dispense Refill  . lisdexamfetamine (VYVANSE) 70 MG capsule Take 1 capsule (70 mg total) by mouth daily. 30 capsule 0  . lisdexamfetamine (VYVANSE) 70 MG capsule Take 1 capsule (70 mg total) by mouth daily. 30 capsule 0  . lisdexamfetamine (VYVANSE) 70 MG capsule Take 1 capsule (70 mg total) by mouth daily. 30 capsule 0  . LO LOESTRIN FE 1 MG-10 MCG / 10 MCG tablet      No current facility-administered medications on file prior to visit.     No Known Allergies   Objective:  BP 135/87   Pulse 94   Temp 98.7 F (37.1 C) (Oral)   Resp 20   Ht 5' 2.72" (1.593 m)   Wt 171 lb 9.6 oz (77.8 kg)   LMP 12/17/2017 (Approximate)   SpO2 99%   Breastfeeding? No   BMI 30.67 kg/m   Physical Exam  Constitutional: She is oriented to person, place, and time. She appears well-developed and well-nourished. No distress.  HENT:  Head: Normocephalic and atraumatic.  Eyes: Conjunctivae are normal.  Neck: Normal  range of motion.  Cardiovascular: Normal rate, regular rhythm and normal heart sounds.  Pulmonary/Chest: Effort normal.  Neurological: She is alert and oriented to person, place, and time.  Skin: Skin is warm and dry.  Psychiatric: She has a normal mood and affect.  Vitals reviewed.   Assessment and Plan :  1. Attention deficit disorder, unspecified hyperactivity presence I have agreed to take over pt's care. She has completed Controlled Substance Agreement.  [place] Controlled Substance Database Registry was reviewed and is consistent with patient's history. ADD well controlled. Continue current tx plan. Advised to contact me via mychart in 3 months for additional 3 months worth of refills. F/u in office in 6 months.  - ToxASSURE Select 13 (MW), Urine - lisdexamfetamine (VYVANSE) 70 MG capsule; Take 1 capsule (70 mg total) by mouth daily.  Dispense: 30 capsule; Refill: 0 - lisdexamfetamine (VYVANSE) 70 MG capsule; Take 1 capsule (70 mg total) by mouth daily.  Dispense: 30 capsule; Refill: 0 - lisdexamfetamine (VYVANSE) 70 MG capsule; Take 1 capsule (70 mg total) by mouth daily.  Dispense: 30 capsule; Refill: 0  2. Flu vaccine need - Flu Vaccine QUAD 36+ mos IM   Benjiman Core PA-C  Primary Care at [facility] 03/11/2018 4:12 PM

## 2018-03-11 ENCOUNTER — Other Ambulatory Visit: Payer: Self-pay

## 2018-03-11 ENCOUNTER — Encounter: Payer: Self-pay | Admitting: Physician Assistant

## 2018-03-11 ENCOUNTER — Ambulatory Visit: Payer: BC Managed Care – PPO | Admitting: Physician Assistant

## 2018-03-11 VITALS — BP 135/87 | HR 94 | Temp 98.7°F | Resp 20 | Ht 62.72 in | Wt 171.6 lb

## 2018-03-11 DIAGNOSIS — Z23 Encounter for immunization: Secondary | ICD-10-CM

## 2018-03-11 DIAGNOSIS — F988 Other specified behavioral and emotional disorders with onset usually occurring in childhood and adolescence: Secondary | ICD-10-CM | POA: Diagnosis not present

## 2018-03-11 MED ORDER — LISDEXAMFETAMINE DIMESYLATE 70 MG PO CAPS: 70.0000 mg | ORAL_CAPSULE | Freq: Every day | ORAL | 0 refills | Status: DC

## 2018-03-11 NOTE — Patient Instructions (Addendum)
I have sent 3 months of vyvanse to your pharmacy. Please contact me at least 10 days before you run out of your last prescription so I can send in an additional 3 months worth of refills. Follow up in office in 6 months. Thank you for letting me participate in your health and well being.   FLEXION RANGE OF MOTION AND STRETCHING EXERCISES: STRETCH - Flexion, Single Knee to Chest   Lie on a firm bed or floor with both legs extended in front of you.  Keeping one leg in contact with the floor, bring your opposite knee to your chest. Hold your leg in place by either grabbing behind your thigh or at your knee.  Pull until you feel a gentle stretch in your lower back.   Slowly release your grasp and repeat the exercise with the opposite side.  STRETCH - Flexion, Double Knee to Chest   Lie on a firm bed or floor with both legs extended in front of you.  Keeping one leg in contact with the floor, bring your opposite knee to your chest.  Tense your stomach muscles to support your back and then lift your other knee to your chest. Hold your legs in place by either grabbing behind your thighs or at your knees.  Pull both knees toward your chest until you feel a gentle stretch in your lower back.   Tense your stomach muscles and slowly return one leg at a time to the floor.  STRETCH - Low Trunk Rotation  Lie on a firm bed or floor. Keeping your legs in front of you, bend your knees so they are both pointed toward the ceiling and your feet are flat on the floor.  Extend your arms out to the side. This will stabilize your upper body by keeping your shoulders in contact with the floor.  Gently and slowly drop both knees together to one side until you feel a gentle stretch in your lower back.   Tense your stomach muscles to support your lower back as you bring your knees back to the starting position. Repeat the exercise to the other side.   EXTENSION RANGE OF MOTION AND FLEXIBILITY  EXERCISES: STRETCH - Extension, Prone on Elbows   Lie on your stomach on the floor, a bed will be too soft. Place your palms about shoulder width apart and at the height of your head.  Place your elbows under your shoulders. If this is too painful, stack pillows under your chest.  Allow your body to relax so that your hips drop lower and make contact more completely with the floor.  Slowly return to lying flat on the floor.  RANGE OF MOTION - Extension, Prone Press Ups  Lie on your stomach on the floor, a bed will be too soft. Place your palms about shoulder width apart and at the height of your head.  Keeping your back as relaxed as possible, slowly straighten your elbows while keeping your hips on the floor. You may adjust the placement of your hands to maximize your comfort. As you gain motion, your hands will come more underneath your shoulders.  Slowly return to lying flat on the floor.  RANGE OF MOTION- Quadruped, Neutral Spine   Assume a hands and knees position on a firm surface. Keep your hands under your shoulders and your knees under your hips. You may place padding under your knees for comfort.  Drop your head and point your tail bone toward the ground below you.  This will round out your lower back like an angry cat.    Slowly lift your head and release your tail bone so that your back sags into a large arch, like an old horse.  Repeat this until you feel limber in your lower back.  Now, find your "sweet spot." This will be the most comfortable position somewhere between the two previous positions. This is your neutral spine. Once you have found this position, tense your stomach muscles to support your lower back.  STRENGTHENING EXERCISES - Low Back Strain These exercises may help you when beginning to rehabilitate your injury. These exercises should be done near your "sweet spot." This is the neutral, low-back arch, somewhere between fully rounded and fully arched, that  is your least painful position. When performed in this safe range of motion, these exercises can be used for people who have either a flexion or extension based injury. These exercises may resolve your symptoms with or without further involvement from your physician, physical therapist or athletic trainer. While completing these exercises, remember:   Muscles can gain both the endurance and the strength needed for everyday activities through controlled exercises.  Complete these exercises as instructed by your physician, physical therapist or athletic trainer. Increase the resistance and repetitions only as guided.  You may experience muscle soreness or fatigue, but the pain or discomfort you are trying to eliminate should never worsen during these exercises. If this pain does worsen, stop and make certain you are following the directions exactly. If the pain is still present after adjustments, discontinue the exercise until you can discuss the trouble with your caregiver.  STRENGTHENING - Deep Abdominals, Pelvic Tilt  Lie on a firm bed or floor. Keeping your legs in front of you, bend your knees so they are both pointed toward the ceiling and your feet are flat on the floor.  Tense your lower abdominal muscles to press your lower back into the floor. This motion will rotate your pelvis so that your tail bone is scooping upwards rather than pointing at your feet or into the floor.  STRENGTHENING - Abdominals, Crunches   Lie on a firm bed or floor. Keeping your legs in front of you, bend your knees so they are both pointed toward the ceiling and your feet are flat on the floor. Cross your arms over your chest.  Slightly tip your chin down without bending your neck.  Tense your abdominals and slowly lift your trunk high enough to just clear your shoulder blades. Lifting higher can put excessive stress on the lower back and does not further strengthen your abdominal muscles.  Control your return  to the starting position.  STRENGTHENING - Quadruped, Opposite UE/LE Lift   Assume a hands and knees position on a firm surface. Keep your hands under your shoulders and your knees under your hips. You may place padding under your knees for comfort.  Find your neutral spine and gently tense your abdominal muscles so that you can maintain this position. Your shoulders and hips should form a rectangle that is parallel with the floor and is not twisted.  Keeping your trunk steady, lift your right hand no higher than your shoulder and then your left leg no higher than your hip. Make sure you are not holding your breath.   Continuing to keep your abdominal muscles tense and your back steady, slowly return to your starting position. Repeat with the opposite arm and leg.  STRENGTHENING - Lower Abdominals, Double Knee  Lift  Lie on a firm bed or floor. Keeping your legs in front of you, bend your knees so they are both pointed toward the ceiling and your feet are flat on the floor.  Tense your abdominal muscles to brace your lower back and slowly lift both of your knees until they come over your hips. Be certain not to hold your breath.  POSTURE AND BODY MECHANICS CONSIDERATIONS - Low Back Strain Keeping correct posture when sitting, standing or completing your activities will reduce the stress put on different body tissues, allowing injured tissues a chance to heal and limiting painful experiences. The following are general guidelines for improved posture. Your physician or physical therapist will provide you with any instructions specific to your needs. While reading these guidelines, remember:  The exercises prescribed by your provider will help you have the flexibility and strength to maintain correct postures.  The correct posture provides the best environment for your joints to work. All of your joints have less wear and tear when properly supported by a spine with good posture. This means you  will experience a healthier, less painful body.  Correct posture must be practiced with all of your activities, especially prolonged sitting and standing. Correct posture is as important when doing repetitive low-stress activities (typing) as it is when doing a single heavy-load activity (lifting). RESTING POSITIONS Consider which positions are most painful for you when choosing a resting position. If you have pain with flexion-based activities (sitting, bending, stooping, squatting), choose a position that allows you to rest in a less flexed posture. You would want to avoid curling into a fetal position on your side. If your pain worsens with extension-based activities (prolonged standing, working overhead), avoid resting in an extended position such as sleeping on your stomach. Most people will find more comfort when they rest with their spine in a more neutral position, neither too rounded nor too arched. Lying on a non-sagging bed on your side with a pillow between your knees, or on your back with a pillow under your knees will often provide some relief. Keep in mind, being in any one position for a prolonged period of time, no matter how correct your posture, can still lead to stiffness. PROPER SITTING POSTURE In order to minimize stress and discomfort on your spine, you must sit with correct posture. Sitting with good posture should be effortless for a healthy body. Returning to good posture is a gradual process. Many people can work toward this most comfortably by using various supports until they have the flexibility and strength to maintain this posture on their own. When sitting with proper posture, your ears will fall over your shoulders and your shoulders will fall over your hips. You should use the back of the chair to support your upper back. Your lower back will be in a neutral position, just slightly arched. You may place a small pillow or folded towel at the base of your lower back for  support.  When working at a desk, create an environment that supports good, upright posture. Without extra support, muscles tire, which leads to excessive strain on joints and other tissues. Keep these recommendations in mind: CHAIR:  A chair should be able to slide under your desk when your back makes contact with the back of the chair. This allows you to work closely.  The chair's height should allow your eyes to be level with the upper part of your monitor and your hands to be slightly lower than  your elbows. BODY POSITION  Your feet should make contact with the floor. If this is not possible, use a foot rest.  Keep your ears over your shoulders. This will reduce stress on your neck and lower back. INCORRECT SITTING POSTURES  If you are feeling tired and unable to assume a healthy sitting posture, do not slouch or slump. This puts excessive strain on your back tissues, causing more damage and pain. Healthier options include:  Using more support, like a lumbar pillow.  Switching tasks to something that requires you to be upright or walking.  Talking a brief walk.  Lying down to rest in a neutral-spine position. PROLONGED STANDING WHILE SLIGHTLY LEANING FORWARD  When completing a task that requires you to lean forward while standing in one place for a long time, place either foot up on a stationary 2-4 inch high object to help maintain the best posture. When both feet are on the ground, the lower back tends to lose its slight inward curve. If this curve flattens (or becomes too large), then the back and your other joints will experience too much stress, tire more quickly, and can cause pain. CORRECT STANDING POSTURES Proper standing posture should be assumed with all daily activities, even if they only take a few moments, like when brushing your teeth. As in sitting, your ears should fall over your shoulders and your shoulders should fall over your hips. You should keep a slight tension in  your abdominal muscles to brace your spine. Your tailbone should point down to the ground, not behind your body, resulting in an over-extended swayback posture.  INCORRECT STANDING POSTURES  Common incorrect standing postures include a forward head, locked knees and/or an excessive swayback. WALKING Walk with an upright posture. Your ears, shoulders and hips should all line-up. PROLONGED ACTIVITY IN A FLEXED POSITION When completing a task that requires you to bend forward at your waist or lean over a low surface, try to find a way to stabilize 3 out of 4 of your limbs. You can place a hand or elbow on your thigh or rest a knee on the surface you are reaching across. This will provide you more stability so that your muscles do not fatigue as quickly. By keeping your knees relaxed, or slightly bent, you will also reduce stress across your lower back. CORRECT LIFTING TECHNIQUES DO :   Assume a wide stance. This will provide you more stability and the opportunity to get as close as possible to the object which you are lifting.  Tense your abdominals to brace your spine. Bend at the knees and hips. Keeping your back locked in a neutral-spine position, lift using your leg muscles. Lift with your legs, keeping your back straight.  Test the weight of unknown objects before attempting to lift them.  Try to keep your elbows locked down at your sides in order get the best strength from your shoulders when carrying an object.  Always ask for help when lifting heavy or awkward objects. INCORRECT LIFTING TECHNIQUES DO NOT:   Lock your knees when lifting, even if it is a small object.  Bend and twist. Pivot at your feet or move your feet when needing to change directions.  Assume that you can safely pick up even a paper clip without proper posture.      IF you received an x-ray today, you will receive an invoice from [facility]. Please contact [facility] at [phone] with  questions or concerns regarding your invoice.  IF you received labwork today, you will receive an invoice from [facility]. Please contact [facility] at [phone] with questions or concerns regarding your invoice.   Our billing staff will not be able to assist you with questions regarding bills from these companies.  You will be contacted with the lab results as soon as they are available. The fastest way to get your results is to activate your My Chart account. Instructions are located on the last page of this paperwork. If you have not heard from [place] regarding the results in 2 weeks, please contact this office.

## 2018-03-13 ENCOUNTER — Ambulatory Visit: Payer: BC Managed Care – PPO | Admitting: Physician Assistant

## 2018-03-14 LAB — TOXASSURE SELECT 13 (MW), URINE

## 2018-04-14 NOTE — Progress Notes (Signed)
NEUROLOGY CONSULTATION NOTE  Rhonda Tran MRN: [id number] DOB: 11-30-1985  Referring provider: Porfirio Oar, PA-C (has since left) Primary care provider: Benjiman Core, PA-C  Reason for consult:  headaches  HISTORY OF PRESENT ILLNESS: *Rhonda Tran is a 32 year old female with ADD and history of TIA secondary to atrial septal defect s/p repair at age 79 who presents for headaches.  History supplemented by referring provider's note.  Onset:  65 - 50 years old Location:  bifrontal Quality:  Aching, non-throbbing Intensity:  2-3/10.  She denies new headache, thunderclap headache or severe headache that wakes her from sleep. Aura:  Peripheral vision loss in both eyes for 10-15 minutes followed by a headache, occasionally without headache.   Prodrome:  no Postdrome:  no Associated symptoms:  Photophobia.  She denies associated nausea, vomiting, autonomic symptoms or unilateral numbness or weakness. Duration:  1 hour with ibuprofen Frequency:  Once every 2 weeks Frequency of abortive medication: 1 to 2 times a month Triggers:  Certain light Relieving factors:  ibuprofen Activity:  Does not affect  Current NSAIDS:  ibuprofen Current analgesics:  none Current triptans:  none Current ergotamine:  none Current anti-emetic:  none Current muscle relaxants:  none Current anti-anxiolytic:  none Current sleep aide:  none Current Antihypertensive medications:  none Current Antidepressant medications: none Current Anticonvulsant medications:  none Current anti-CGRP:  none Current Vitamins/Herbal/Supplements:  none Current Antihistamines/Decongestants:  none Other therapy:  none Hormone/birth control:  Lo Loestrin Fe 1 Other medication:  Vyvanse  Past NSAIDS:  none Past analgesics:  none Past abortive triptans:  none Past abortive ergotamine:  none Past muscle relaxants:  none Past anti-emetic:  none Past antihypertensive medications:  none Past antidepressant  medications:  none Past anticonvulsant medications:  none Past anti-CGRP:  none Past vitamins/Herbal/Supplements:  none Past antihistamines/decongestants:  none Other past therapies:  none  Caffeine:  1 cup of coffee in AM Alcohol:  No Smoker:  No Diet:  Needs to increase water intake.  Does not skip meals Exercise:  Not routine Depression: no ; Anxiety:  Mild but improved Other pain:  no Sleep hygiene:  good Family history of headache:  Mom  At age 89, she had a TIA with left sided numbness (face and arm) with headache, lasting 30 minutes.  She was found to have an atrial septal defect which was repaired.  Following surgery, she was having terrible headaches.  Of note, she also has history of syncope presenting with tunnel vision and aural fullness.  PAST MEDICAL HISTORY: Past Medical History:  Diagnosis Date  . First degree perineal laceration during delivery 01/12/2017  . Gestational HTN 2018   son delivered 01/12/2017  . Maternal anemia, with delivery 01/13/2017  . Migraine headache 06/14/2012  . Stroke [facility])    mini stroke; found septal defect; occluder inserted when pt was 32 y/o    PAST SURGICAL HISTORY: Past Surgical History:  Procedure Laterality Date  . CARDIAC SURGERY      MEDICATIONS: Current Outpatient Medications on File Prior to Visit  Medication Sig Dispense Refill  . lisdexamfetamine (VYVANSE) 70 MG capsule Take 1 capsule (70 mg total) by mouth daily. 30 capsule 0  . lisdexamfetamine (VYVANSE) 70 MG capsule Take 1 capsule (70 mg total) by mouth daily. 30 capsule 0  . lisdexamfetamine (VYVANSE) 70 MG capsule Take 1 capsule (70 mg total) by mouth daily. 30 capsule 0  . LO LOESTRIN FE 1 MG-10 MCG / 10 MCG tablet  No current facility-administered medications on file prior to visit.     ALLERGIES: No Known Allergies  FAMILY HISTORY: Family History  Problem Relation Age of Onset  . Cancer Maternal Grandmother   . Cancer Maternal Grandfather    SOCIAL  HISTORY: Social History   Socioeconomic History  . Marital status: Married    Spouse name: Rhonda Tran  . Number of children: 1  . Years of education: Master's Degree  . Highest education level: Not on file  Occupational History  . 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 Financial resource strain: Not on file  . Food insecurity:    Worry: Not on file    Inability: Not on file  . Transportation needs:    Medical: Not on file    Non-medical: Not on file  Tobacco Use  . Smoking status: Never Smoker  . Smokeless tobacco: Never Used  Substance and Sexual Activity  . Alcohol use: No    Alcohol/week: 0.0 standard drinks  . Drug use: No  . Sexual activity: Yes    Birth control/protection: Pill  Lifestyle  . Physical activity:    Days per week: Not on file    Minutes per session: Not on file  . Stress: Not on file  Relationships  . Social connections:    Talks on phone: Not on file    Gets together: Not on file    Attends religious service: Not on file    Active member of club or organization: Not on file    Attends meetings of clubs or organizations: Not on file    Relationship status: Not on file  . Intimate partner violence:    Fear of current or ex partner: Not on file    Emotionally abused: Not on file    Physically abused: Not on file    Forced sexual activity: Not on file  Other Topics Concern  . Not on file  Social History Narrative   Lives with her husband, their son, Beckey Downing and their dog, Melanie Crazier.   Family lives nearby.   Mother cares for her son while she is working.    REVIEW OF SYSTEMS: Constitutional: No fevers, chills, or sweats, no generalized fatigue, change in appetite Eyes: No visual changes, double vision, eye pain Ear, nose and throat: No hearing loss, ear pain, nasal congestion, sore throat Cardiovascular: No chest pain, palpitations Respiratory:  No shortness of breath at rest or  with exertion, wheezes GastrointestinaI: No nausea, vomiting, diarrhea, abdominal pain, fecal incontinence Genitourinary:  No dysuria, urinary retention or frequency Musculoskeletal:  No neck pain, back pain Integumentary: No rash, pruritus, skin lesions Neurological: as above Psychiatric: No depression, insomnia, anxiety Endocrine: No palpitations, fatigue, diaphoresis, mood swings, change in appetite, change in weight, increased thirst Hematologic/Lymphatic:  No purpura, petechiae. Allergic/Immunologic: no itchy/runny eyes, nasal congestion, recent allergic reactions, rashes  PHYSICAL EXAM: Blood pressure 120/80, pulse 85, height 5\' 2"  (1.575 m), weight 174 lb 8 oz (79.2 kg), SpO2 99 %, not currently breastfeeding. General: No acute distress.  Patient appears well-groomed.  Head:  Normocephalic/atraumatic Eyes:  fundi examined but not visualized Neck: supple, no paraspinal tenderness, full range of motion Back: No paraspinal tenderness Heart: regular rate and rhythm Lungs: Clear to auscultation bilaterally. Vascular: No carotid bruits. Neurological Exam: Mental status: alert and oriented to person, place, and time, recent and remote memory intact, fund of knowledge intact, attention and concentration intact, speech  fluent and not dysarthric, language intact. Cranial nerves: CN I: not tested CN II: pupils equal, round and reactive to light, visual fields intact CN III, IV, VI:  full range of motion, no nystagmus, no ptosis CN V: facial sensation intact CN VII: upper and lower face symmetric CN VIII: hearing intact CN IX, X: gag intact, uvula midline CN XI: sternocleidomastoid and trapezius muscles intact CN XII: tongue midline Bulk & Tone: normal, no fasciculations. Motor:  5/5 throughout  Sensation: temperature and vibration sensation intact. Deep Tendon Reflexes:  2+ throughout, toes downgoing.  Finger to nose testing:  Without dysmetria.  Heel to shin:  Without dysmetria.    Gait:  Normal station and stride.  Romberg negative.  IMPRESSION: Migraine with aura, without status migrainosus, not intractable.  PLAN: 1.  Migraines are infrequent, so no preventative medication indicated 2.  Continue ibuprofen for abortive therapy 3.  Limit use of pain relievers to no more than 2 days out of week to prevent risk of rebound or medication-overuse headache. 4.  Continue headache diary 5.  Increase exercise and hydration 6.  May want to follow up with OBGYN to discuss changes in birth control 7.  Follow up as needed.  Thank you for allowing me to take part in the care of this patient.  45 minutes spent face to face with patient, over 50% spent discussing diagnosis and management.  Shon Millet, DO  CC:  Benjiman Core, PA-C  Noland Fordyce, MD

## 2018-04-15 ENCOUNTER — Encounter: Payer: Self-pay | Admitting: Neurology

## 2018-04-15 ENCOUNTER — Ambulatory Visit: Payer: BC Managed Care – PPO | Admitting: Neurology

## 2018-04-15 VITALS — BP 120/80 | HR 85 | Ht 62.0 in | Wt 174.5 lb

## 2018-04-15 DIAGNOSIS — G43109 Migraine with aura, not intractable, without status migrainosus: Secondary | ICD-10-CM | POA: Diagnosis not present

## 2018-04-15 NOTE — Patient Instructions (Signed)
I do think you have migraine with aura.  Therefore, your OBGYN may want to change your birth control. 1.  Take ibuprofen at earliest onset of migraine (when your eyes feel funny). 2.  Limit use of pain relievers to no more than 2 days out of week to prevent risk of rebound or medication-overuse headache. 3.  Continue headache diary 4.  Increase exercise, water intake. 5.  Follow up as needed.

## 2018-04-16 NOTE — Progress Notes (Signed)
Note faxed.

## 2018-10-03 ENCOUNTER — Emergency Department (HOSPITAL_BASED_OUTPATIENT_CLINIC_OR_DEPARTMENT_OTHER): Payer: BC Managed Care – PPO

## 2018-10-03 ENCOUNTER — Encounter (HOSPITAL_BASED_OUTPATIENT_CLINIC_OR_DEPARTMENT_OTHER): Payer: Self-pay

## 2018-10-03 ENCOUNTER — Emergency Department (HOSPITAL_BASED_OUTPATIENT_CLINIC_OR_DEPARTMENT_OTHER)
Admission: EM | Admit: 2018-10-03 | Discharge: 2018-10-03 | Disposition: A | Discharge status: Home / Self Care | Payer: BC Managed Care – PPO | Attending: Emergency Medicine | Admitting: Emergency Medicine

## 2018-10-03 ENCOUNTER — Other Ambulatory Visit: Payer: Self-pay

## 2018-10-03 DIAGNOSIS — Y999 Unspecified external cause status: Secondary | ICD-10-CM | POA: Diagnosis not present

## 2018-10-03 DIAGNOSIS — Y929 Unspecified place or not applicable: Secondary | ICD-10-CM | POA: Insufficient documentation

## 2018-10-03 DIAGNOSIS — Y9301 Activity, walking, marching and hiking: Secondary | ICD-10-CM | POA: Insufficient documentation

## 2018-10-03 DIAGNOSIS — Z79899 Other long term (current) drug therapy: Secondary | ICD-10-CM | POA: Insufficient documentation

## 2018-10-03 DIAGNOSIS — S82831A Other fracture of upper and lower end of right fibula, initial encounter for closed fracture: Secondary | ICD-10-CM

## 2018-10-03 DIAGNOSIS — W108XXA Fall (on) (from) other stairs and steps, initial encounter: Secondary | ICD-10-CM | POA: Diagnosis not present

## 2018-10-03 DIAGNOSIS — S8991XA Unspecified injury of right lower leg, initial encounter: Secondary | ICD-10-CM | POA: Diagnosis present

## 2018-10-03 MED ORDER — OXYCODONE-ACETAMINOPHEN 5-325 MG PO TABS: 1.0000 | ORAL_TABLET | Freq: Four times a day (QID) | ORAL | 0 refills | Status: DC | PRN

## 2018-10-03 MED ORDER — OXYCODONE-ACETAMINOPHEN 5-325 MG PO TABS
1.0000 | ORAL_TABLET | Freq: Once | ORAL | Status: AC
  Administered 2018-10-03: 1 via ORAL
  Filled 2018-10-03: qty 1

## 2018-10-03 NOTE — ED Triage Notes (Signed)
Pt tripped down the stairs and rolled her right ankle, swelling and bruising present, pt did not take anything for pain prior to arrival

## 2018-10-03 NOTE — Discharge Instructions (Addendum)
Please read and follow all provided instructions.  You have been seen today for a right ankle injury.  Your x-rays revealed that you have a fracture to the outer aspect of your ankle (fibula).  We have placed you into a cam walker boot for this fracture.  We would like you to remain in the cam walker at all times until you have followed up with orthopedics.  Additionally do not put weight on the right lower extremity until you have followed up with orthopedics.  Use crutches to avoid weightbearing  Please call Dr. Eliberto Ivory office to follow-up within 1 week.  Home care instructions: -- *PRICE in the first 24-48 hours after injury: Protect with CAM Walker Rest Ice- Do not apply ice pack directly to your skin, place towel or similar between your skin and ice/ice pack. Apply ice for 20 min, then remove for 40 min while awake Compression- Wear brace, elastic bandage, splint as directed by your provider Elevate affected extremity above the level of your heart when not walking around for the first 24-48 hours   Medications:  Take ibuprofen 600 mg every 6 hours as needed for pain and swelling.  Should you have severe pain we provided a short-term prescription for Percocet.  -Percocet-this is a narcotic/controlled substance medication that has potential addicting qualities.  We recommend that you take 1-2 tablets every 6 hours as needed for severe pain.  Do not drive or operate heavy machinery when taking this medicine as it can be sedating. Do not drink alcohol or take other sedating medications when taking this medicine for safety reasons.  Keep this out of reach of small children.  Please be aware this medicine has Tylenol in it (325 mg/tab) do not exceed the maximum dose of Tylenol in a day per over the counter recommendations should you decide to supplement with Tylenol over the counter.   We have prescribed you new medication(s) today. Discuss the medications prescribed today with your pharmacist as  they can have adverse effects and interactions with your other medicines including over the counter and prescribed medications. Seek medical evaluation if you start to experience new or abnormal symptoms after taking one of these medicines, seek care immediately if you start to experience difficulty breathing, feeling of your throat closing, facial swelling, or rash as these could be indications of a more serious allergic reaction   Follow-up instructions: Please follow-up with orthopedics within 1 week.  Return instructions:  Please return if your digits or extremity are numb or tingling, appear gray or blue, or you have severe pain (also elevate the extremity and loosen splint or wrap if you were given one) Please return if you have redness or fevers.  Please return to the Emergency Department if you experience worsening symptoms.  Please return if you have any other emergent concerns. Additional Information:  Your vital signs today were: BP (!) 124/92 (BP Location: Right Arm)    Pulse 94    Temp 97.7 F (36.5 C) (Oral)    Resp 18    Ht 5\' 3"  (1.6 m)    Wt 81.6 kg    SpO2 100%    BMI 31.89 kg/m  If your blood pressure (BP) was elevated above 135/85 this visit, please have this repeated by your doctor within one month. ---------------

## 2018-10-03 NOTE — ED Provider Notes (Signed)
[facility] EMERGENCY DEPARTMENT Provider Note   CSN: [id number] Arrival date & time: 10/03/18  1826    History   Chief Complaint Chief Complaint  Patient presents with  . Ankle Injury    HPI Rhonda Tran is a 33 y.o. female with a hx of migraines who presents to the ED s/p mechanical fall shortly PTA with complaints of R ankle pain/swelling. Patient states she was ambulating down the stairs when she tripped, rolled the right ankle, and fell to the ground. . Denies head injury or LOC. Notes immediate onset of pain to the R ankle with associated swelling. Pain is a 4/10 in severity, worse with movement, no alleviating factors, no intervention PTA. Denies numbness, tingling, or weakness.     HPI  Past Medical History:  Diagnosis Date  . First degree perineal laceration during delivery 01/12/2017  . Gestational HTN 2018   son delivered 01/12/2017  . Maternal anemia, with delivery 01/13/2017  . Migraine headache 06/14/2012  . Stroke [facility])    mini stroke; found septal defect; occluder inserted when pt was 33 y/o    Patient Active Problem List   Diagnosis Date Noted  . History of percutaneous transcatheter closure of congenital ASD 06/14/2012  . ADD (attention deficit disorder) 06/14/2012    Past Surgical History:  Procedure Laterality Date  . CARDIAC SURGERY       OB History    Gravida  1   Para  1   Term  1   Preterm      AB      Living  1     SAB      TAB      Ectopic      Multiple  0   Live Births  1            Home Medications    Prior to Admission medications   Medication Sig Start Date End Date Taking? Authorizing Provider  lisdexamfetamine (VYVANSE) 70 MG capsule Take 1 capsule (70 mg total) by mouth daily. 03/11/18   Benjiman Core D, PA-C  LO LOESTRIN FE 1 MG-10 MCG / 10 MCG tablet  02/21/17   [provider]    Family History Family History  Problem Relation Age of Onset  . Cancer Maternal Grandmother   .  Cancer Maternal Grandfather     Social History Social History   Tobacco Use  . Smoking status: Never Smoker  . Smokeless tobacco: Never Used  Substance Use Topics  . Alcohol use: No    Alcohol/week: 0.0 standard drinks  . Drug use: No     Allergies   Patient has no known allergies.   Review of Systems Review of Systems  Constitutional: Negative for chills and fever.  Eyes: Negative for visual disturbance.  Respiratory: Negative for shortness of breath.   Cardiovascular: Negative for chest pain.  Gastrointestinal: Negative for vomiting.  Musculoskeletal: Positive for arthralgias and joint swelling. Negative for back pain and neck pain.  Neurological: Negative for syncope, weakness and numbness.   Physical Exam Updated Vital Signs BP (!) 124/92 (BP Location: Right Arm)   Pulse 94   Temp 97.7 F (36.5 C) (Oral)   Resp 18   Ht 5\' 3"  (1.6 m)   Wt 81.6 kg   SpO2 100%   BMI 31.89 kg/m   Physical Exam Vitals signs and nursing note reviewed.  Constitutional:      General: She is not in acute distress.    Appearance:  She is not ill-appearing or toxic-appearing.  HENT:     Head: Normocephalic and atraumatic.     Comments: No raccoon eyes or battle sign. Eyes:     Extraocular Movements: Extraocular movements intact.     Pupils: Pupils are equal, round, and reactive to light.  Neck:     Musculoskeletal: Normal range of motion.  Cardiovascular:     Rate and Rhythm: Normal rate and regular rhythm.     Pulses:          Dorsalis pedis pulses are 2+ on the right side and 2+ on the left side.       Posterior tibial pulses are 2+ on the right side and 2+ on the left side.  Pulmonary:     Effort: Pulmonary effort is normal.     Breath sounds: Normal breath sounds.  Chest:     Chest wall: No tenderness.  Abdominal:     Palpations: Abdomen is soft.     Tenderness: There is no abdominal tenderness.  Musculoskeletal:     Comments: Upper extremities: Normal active range of  motion throughout.  No point/focal bony tenderness Back: No midline tenderness to palpation. Lower extremities: Patient has notable soft tissue swelling to the right ankle most prominent to the lateral aspect with some mild ecchymosis noted as well.  No significant open wounds. Patient has intact AROM to bilateral hips, knees, ankles, and all digits. Tender to palpation diffusely to the R ankle extending to the midfoot including the navicular bone and the base of the fifth metatarsal.  Her most prominent area of tenderness is the lateral malleolus.  Lower extremities are otherwise nontender.  Specifically no tenderness to the fibular head.  Skin:    General: Skin is warm and dry.     Capillary Refill: Capillary refill takes less than 2 seconds.  Neurological:     Mental Status: She is alert.     Comments: Alert. Clear speech. Sensation grossly intact to bilateral lower extremities. 5/5 strength with plantar/dorsiflexion bilaterally.   Psychiatric:        Mood and Affect: Mood normal.        Behavior: Behavior normal.    ED Treatments / Results  Labs (all labs ordered are listed, but only abnormal results are displayed) Labs Reviewed - No data to display  EKG None  Radiology Dg Ankle Complete Right  Result Date: 10/03/2018 CLINICAL DATA:  Larey SeatFell down stairs with pain and swelling. EXAM: RIGHT ANKLE - COMPLETE 3+ VIEW COMPARISON:  None. FINDINGS: Transverse fracture of the tip of the fibula. Question mild widening of the ankle mortise. No other fracture is seen. Marked lateral soft tissue swelling. IMPRESSION: Transverse fracture of the distal fibula, 1 cm from the end of the bone. Question mild widening of the ankle mortise. Electronically Signed   By: Paulina FusiMark  Shogry M.D.   On: 10/03/2018 19:26   Dg Foot Complete Right  Result Date: 10/03/2018 CLINICAL DATA:  Larey SeatFell down the stairs.  Pain and swelling. EXAM: RIGHT FOOT COMPLETE - 3+ VIEW COMPARISON:  None. FINDINGS: Distal fibular fracture as  described on the ankle study. No fracture of the foot. IMPRESSION: Negative for foot fracture. Distal fibular fracture as previously described. Electronically Signed   By: Paulina FusiMark  Shogry M.D.   On: 10/03/2018 19:27    Procedures Procedures (including critical care time)  SPLINT APPLICATION Date/Time: 7:45 PM Authorized by: Harvie HeckSamantha Olympia Adelsberger Consent: Verbal consent obtained. Risks and benefits: risks, benefits and alternatives were discussed Consent given  by: patient Splint applied by: ED technician Location details: RLE Splint type: Cam walker Supplies used: Cam walker Post-procedure: The splinted body part was neurovascularly unchanged following the procedure. Patient tolerance: Patient tolerated the procedure well with no immediate complications.  Medications Ordered in ED Medications  oxyCODONE-acetaminophen (PERCOCET/ROXICET) 5-325 MG per tablet 1 tablet (has no administration in time range)     Initial Impression / Assessment and Plan / ED Course  I have reviewed the triage vital signs and the nursing notes.  Pertinent labs & imaging results that were available during my care of the patient were reviewed by me and considered in my medical decision making (see chart for details).   Patient presents to the emergency department status post mechanical fall with complaints of right ankle pain and swelling.  No evidence of serious head, neck, or back injury.  No midline spinal tenderness or focal neurologic deficits.  No chest or abdominal tenderness to suggest acute intrathoracic or intra-abdominal injury.  Seems to have localized injury to the right ankle.  She has notable soft tissue swelling with mild ecchymosis, no significant open wounds. Active range of motion intact. Diffuse tenderness to the right ankle and midfoot with most prominent area of tenderness being the lateral malleolus.  Neurovascularly intact distally. X-ray with transverse fracture of the distal fibula, 1 cm from  the end of the bone. Question mild widening of the ankle mortise per radiology. I have reviewed imaging. Will place in cam walker, patient has crutches to use at home and does not wish to receive new ones in the ER, will have her be non weight bearing with orthopedics follow up. PRICE. Short term prescription for percocet. I discussed results, treatment plan, need for follow-up, and return precautions with the patient. Provided opportunity for questions, patient confirmed understanding and is in agreement with plan.   Findings and plan of care discussed with supervising physician Dr. Patria Mane who is in agreement.    Final Clinical Impressions(s) / ED Diagnoses   Final diagnoses:  Closed fracture of distal end of right fibula, unspecified fracture morphology, initial encounter    ED Discharge Orders         Ordered    oxyCODONE-acetaminophen (PERCOCET/ROXICET) 5-325 MG tablet  Every 6 hours PRN     10/03/18 1944           Cherly Anderson, PA-C 10/03/18 1950    Azalia Bilis, MD 10/03/18 2045

## 2018-10-03 NOTE — ED Notes (Signed)
DC instructions reviewed with pt, opportunity for questions provided. Pt states pain has improved since CAM walker and PO pain med given

## 2018-10-03 NOTE — ED Notes (Signed)
Escorted to exit via wheelchair

## 2019-05-17 IMAGING — CR RIGHT ANKLE - COMPLETE 3+ VIEW
3 series · 3 of 3 positions shown · non-contrast
Comparison: None.

CLINICAL DATA: Fell down stairs with pain and swelling.

EXAM:
RIGHT ANKLE - COMPLETE 3+ VIEW

[t ankle joint ap right]
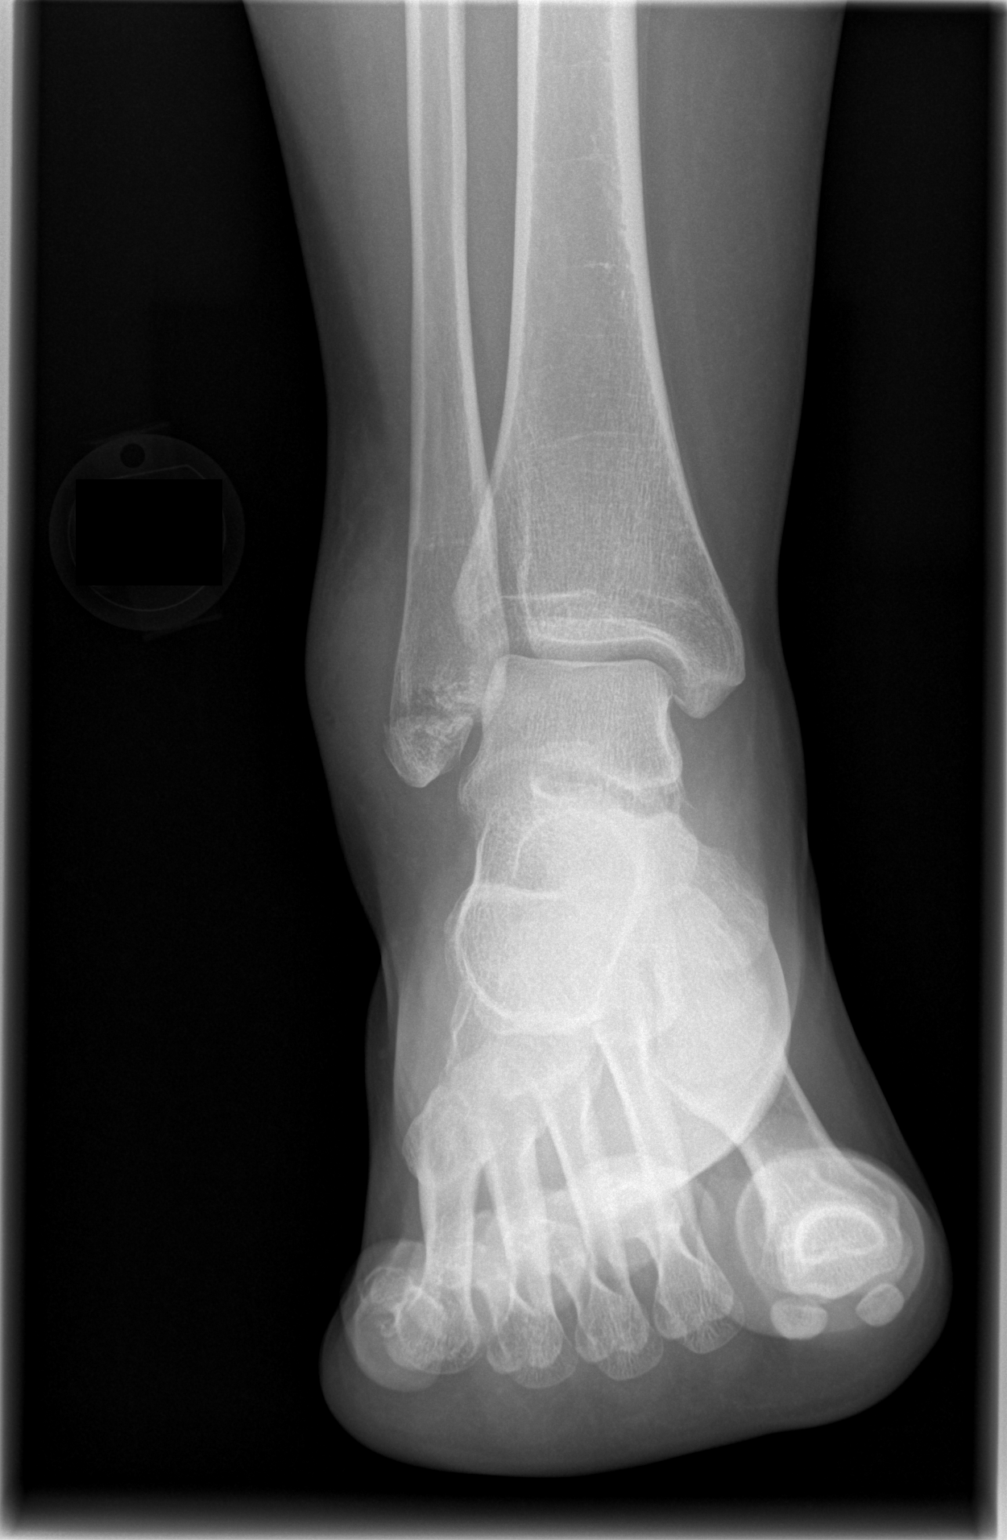

[t ankle joint oblique right]
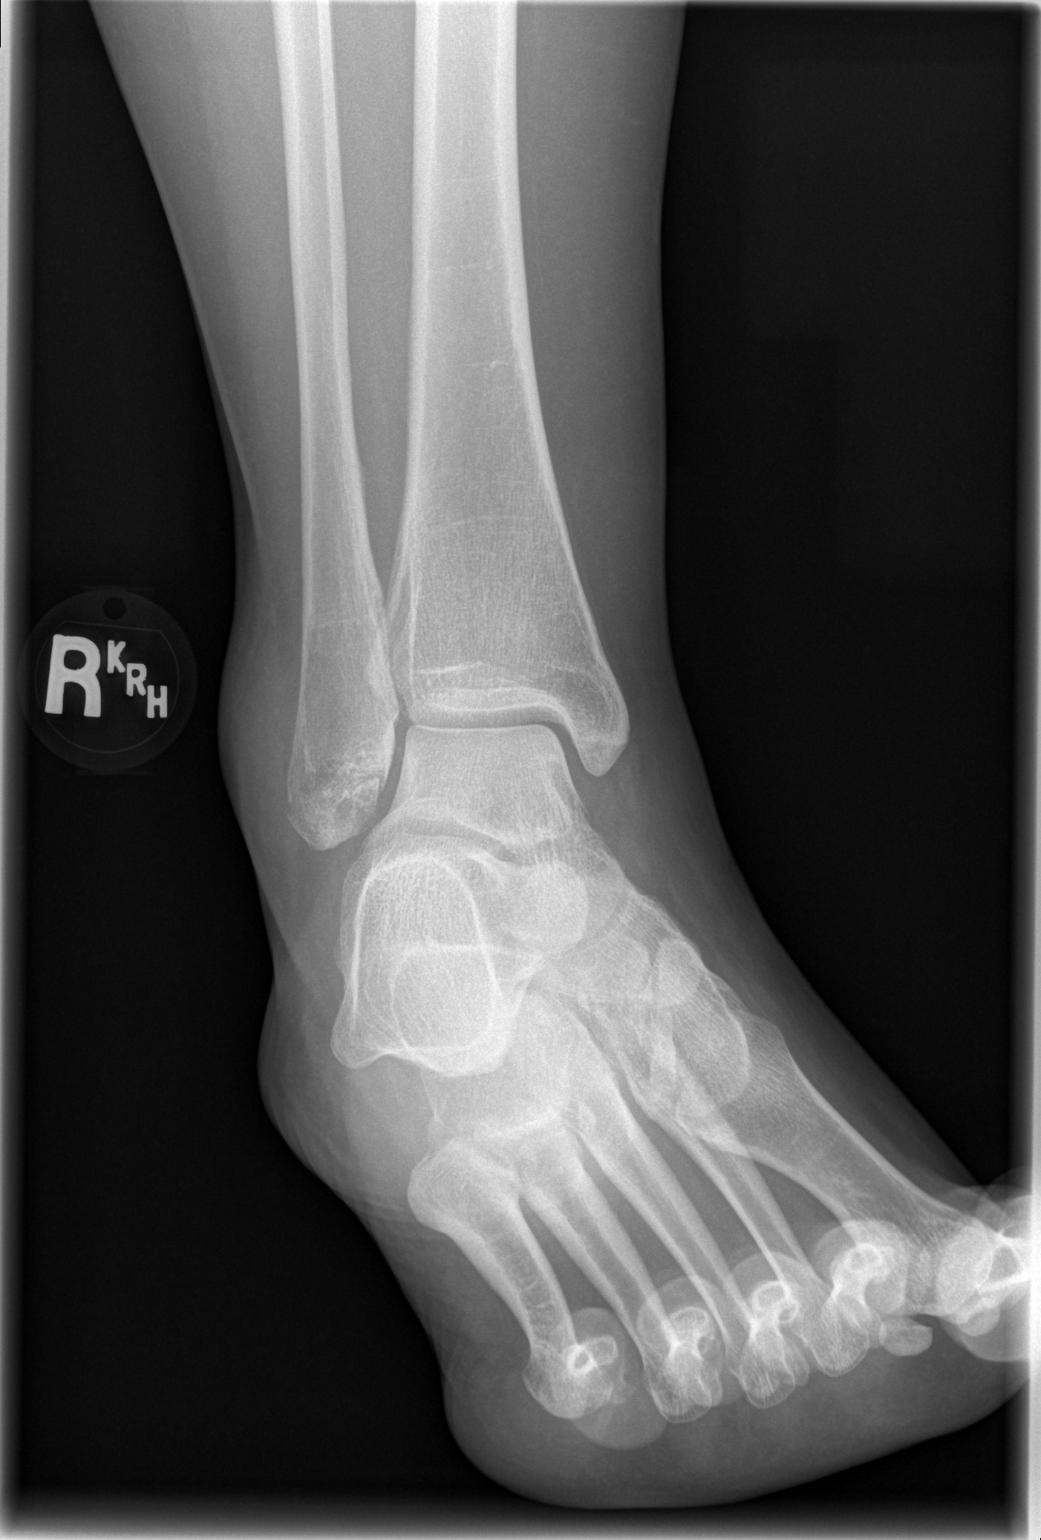

[t ankle joint lat right]
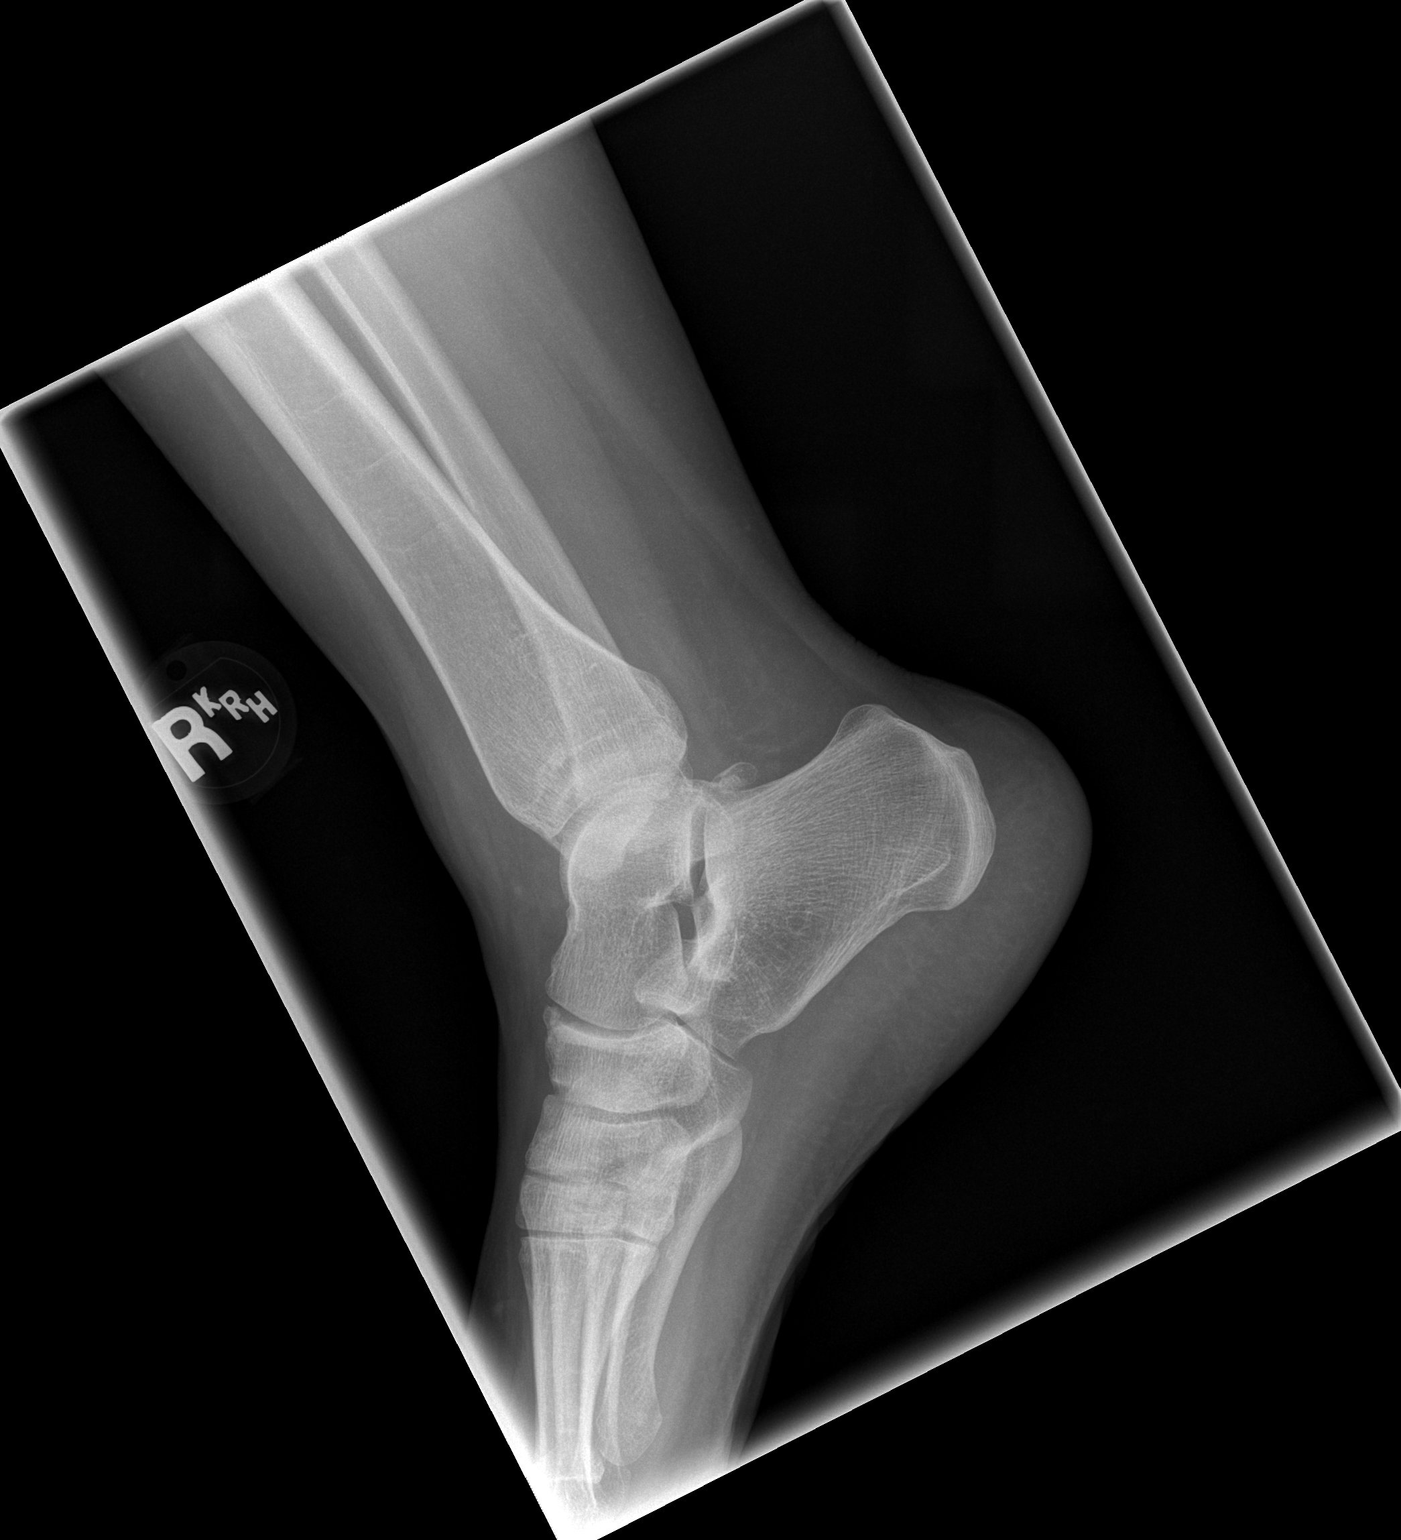

[3 of 3 positions shown; findings below may reference images not displayed]

FINDINGS: Transverse fracture of the tip of the fibula. Question mild widening
of the ankle mortise. No other fracture is seen. Marked lateral soft
tissue swelling.
IMPRESSION: Transverse fracture of the distal fibula, 1 cm from the end of the
bone. Question mild widening of the ankle mortise.

## 2020-04-06 ENCOUNTER — Other Ambulatory Visit: Payer: Self-pay | Admitting: Gastroenterology

## 2020-04-06 ENCOUNTER — Other Ambulatory Visit (HOSPITAL_COMMUNITY): Payer: Self-pay | Admitting: Gastroenterology

## 2020-04-06 DIAGNOSIS — R1011 Right upper quadrant pain: Secondary | ICD-10-CM

## 2020-05-02 ENCOUNTER — Ambulatory Visit (HOSPITAL_COMMUNITY)
Admission: RE | Admit: 2020-05-02 | Discharge: 2020-05-02 | Disposition: A | Discharge status: Home / Self Care | Payer: BC Managed Care – PPO | Source: Ambulatory Visit | Attending: Gastroenterology | Admitting: Gastroenterology

## 2020-05-02 ENCOUNTER — Encounter (HOSPITAL_COMMUNITY): Payer: Self-pay

## 2020-05-02 ENCOUNTER — Other Ambulatory Visit: Payer: Self-pay

## 2020-05-02 DIAGNOSIS — R1011 Right upper quadrant pain: Secondary | ICD-10-CM | POA: Diagnosis not present

## 2020-05-13 ENCOUNTER — Ambulatory Visit: Payer: Self-pay | Admitting: Surgery

## 2020-05-23 ENCOUNTER — Other Ambulatory Visit (HOSPITAL_COMMUNITY)
Admission: RE | Admit: 2020-05-23 | Discharge: 2020-05-23 | Disposition: A | Discharge status: Home / Self Care | Payer: BC Managed Care – PPO | Source: Ambulatory Visit | Attending: Surgery | Admitting: Surgery

## 2020-05-23 DIAGNOSIS — Z20822 Contact with and (suspected) exposure to covid-19: Secondary | ICD-10-CM | POA: Insufficient documentation

## 2020-05-23 DIAGNOSIS — Z8673 Personal history of transient ischemic attack (TIA), and cerebral infarction without residual deficits: Secondary | ICD-10-CM | POA: Diagnosis not present

## 2020-05-23 DIAGNOSIS — Z01812 Encounter for preprocedural laboratory examination: Secondary | ICD-10-CM | POA: Insufficient documentation

## 2020-05-23 DIAGNOSIS — K8010 Calculus of gallbladder with chronic cholecystitis without obstruction: Secondary | ICD-10-CM | POA: Diagnosis present

## 2020-05-23 LAB — SARS CORONAVIRUS 2 (TAT 6-24 HRS): SARS Coronavirus 2: NEGATIVE

## 2020-05-25 ENCOUNTER — Encounter (HOSPITAL_COMMUNITY): Payer: Self-pay | Admitting: Surgery

## 2020-05-25 NOTE — Progress Notes (Signed)
PCP:  Theora Gianotti, MD Cardiologist:  Denies  EKG: 12/12/17 CXR:  N/A ECHO:  N/A Stress Test:  N/A Cardiac Cath:  N/A  Fasting Blood Sugar-  N/A Checks Blood Sugar__N/A_ times a day  ASA/Blood Thinners:  NONE  OSA/CPAP:  NONE  Anesthesia Review:  No  Patient denies shortness of breath, fever, cough, and chest pain at PAT appointment.  Patient verbalized understanding of instructions provided today at the PAT appointment.  Patient asked to review instructions at home and day of surgery.

## 2020-05-25 NOTE — Anesthesia Preprocedure Evaluation (Addendum)
Anesthesia Evaluation  Patient identified by MRN, date of birth, ID band Patient awake    Reviewed: Allergy & Precautions, NPO status , Patient's Chart, lab work & pertinent test results  Airway Mallampati: I  TM Distance: >3 FB Neck ROM: Full    Dental no notable dental hx. (+) Teeth Intact, Dental Advisory Given   Pulmonary neg pulmonary ROS,    Pulmonary exam normal breath sounds clear to auscultation       Cardiovascular hypertension, Normal cardiovascular exam Rhythm:Regular Rate:Normal     Neuro/Psych  Headaches, PSYCHIATRIC DISORDERS negative neurological ROS     GI/Hepatic negative GI ROS, Neg liver ROS,   Endo/Other  negative endocrine ROS  Renal/GU negative Renal ROS  negative genitourinary   Musculoskeletal negative musculoskeletal ROS (+)   Abdominal   Peds negative pediatric ROS (+)  Hematology  (+) Blood dyscrasia, anemia ,   Anesthesia Other Findings   Reproductive/Obstetrics negative OB ROS                            Anesthesia Physical  Anesthesia Plan  ASA: II  Anesthesia Plan: General   Post-op Pain Management:    Induction: Intravenous  PONV Risk Score and Plan: 3 and Ondansetron, Dexamethasone and Treatment may vary due to age or medical condition  Airway Management Planned: Oral ETT  Additional Equipment:   Intra-op Plan:   Post-operative Plan: Extubation in OR  Informed Consent:   Plan Discussed with: Anesthesiologist and CRNA  Anesthesia Plan Comments: (  )        Anesthesia Quick Evaluation

## 2020-05-26 ENCOUNTER — Ambulatory Visit (HOSPITAL_COMMUNITY): Payer: BC Managed Care – PPO | Admitting: Anesthesiology

## 2020-05-26 ENCOUNTER — Encounter (HOSPITAL_COMMUNITY): Payer: Self-pay | Admitting: Surgery

## 2020-05-26 ENCOUNTER — Encounter (HOSPITAL_COMMUNITY)
Admission: RE | Disposition: A | Discharge status: Home / Self Care | Payer: Self-pay | Source: Home / Self Care | Attending: Surgery

## 2020-05-26 ENCOUNTER — Ambulatory Visit (HOSPITAL_COMMUNITY)
Admission: RE | Admit: 2020-05-26 | Discharge: 2020-05-26 | Disposition: A | Discharge status: Home / Self Care | Payer: BC Managed Care – PPO | Attending: Surgery | Admitting: Surgery

## 2020-05-26 DIAGNOSIS — Z8673 Personal history of transient ischemic attack (TIA), and cerebral infarction without residual deficits: Secondary | ICD-10-CM | POA: Insufficient documentation

## 2020-05-26 DIAGNOSIS — K8010 Calculus of gallbladder with chronic cholecystitis without obstruction: Secondary | ICD-10-CM | POA: Insufficient documentation

## 2020-05-26 DIAGNOSIS — Z20822 Contact with and (suspected) exposure to covid-19: Secondary | ICD-10-CM | POA: Insufficient documentation

## 2020-05-26 HISTORY — PX: CHOLECYSTECTOMY: SHX55

## 2020-05-26 LAB — CBC
HCT: 44.4 % (ref 36.0–46.0)
Hemoglobin: 14.4 g/dL (ref 12.0–15.0)
MCH: 27.8 pg (ref 26.0–34.0)
MCHC: 32.4 g/dL (ref 30.0–36.0)
MCV: 85.7 fL (ref 80.0–100.0)
Platelets: 361 10*3/uL (ref 150–400)
RBC: 5.18 MIL/uL — ABNORMAL HIGH (ref 3.87–5.11)
RDW: 12.8 % (ref 11.5–15.5)
WBC: 7 10*3/uL (ref 4.0–10.5)
nRBC: 0 % (ref 0.0–0.2)

## 2020-05-26 LAB — POCT PREGNANCY, URINE: Preg Test, Ur: NEGATIVE

## 2020-05-26 SURGERY — LAPAROSCOPIC CHOLECYSTECTOMY
Anesthesia: General

## 2020-05-26 MED ORDER — MIDAZOLAM HCL 2 MG/2ML IJ SOLN
INTRAMUSCULAR | Status: AC
  Filled 2020-05-26: qty 2

## 2020-05-26 MED ORDER — ALBUMIN HUMAN 5 % IV SOLN: INTRAVENOUS | Status: DC | PRN

## 2020-05-26 MED ORDER — LACTATED RINGERS IV SOLN: INTRAVENOUS | Status: DC

## 2020-05-26 MED ORDER — CEFAZOLIN SODIUM-DEXTROSE 2-4 GM/100ML-% IV SOLN
2.0000 g | INTRAVENOUS | Status: DC
  Filled 2020-05-26: qty 100

## 2020-05-26 MED ORDER — ONDANSETRON HCL 4 MG/2ML IJ SOLN
INTRAMUSCULAR | Status: AC
  Filled 2020-05-26: qty 2

## 2020-05-26 MED ORDER — CEFAZOLIN SODIUM-DEXTROSE 2-3 GM-%(50ML) IV SOLR
INTRAVENOUS | Status: DC | PRN
  Administered 2020-05-26: 2 g via INTRAVENOUS

## 2020-05-26 MED ORDER — 0.9 % SODIUM CHLORIDE (POUR BTL) OPTIME
TOPICAL | Status: DC | PRN
  Administered 2020-05-26: 1000 mL

## 2020-05-26 MED ORDER — DEXAMETHASONE SODIUM PHOSPHATE 4 MG/ML IJ SOLN
INTRAMUSCULAR | Status: DC | PRN
  Administered 2020-05-26: 4 mg via INTRAVENOUS

## 2020-05-26 MED ORDER — PROPOFOL 10 MG/ML IV BOLUS
INTRAVENOUS | Status: DC | PRN
  Administered 2020-05-26: 120 mg via INTRAVENOUS

## 2020-05-26 MED ORDER — LIDOCAINE 2% (20 MG/ML) 5 ML SYRINGE
INTRAMUSCULAR | Status: DC | PRN
  Administered 2020-05-26: 60 mg via INTRAVENOUS

## 2020-05-26 MED ORDER — EPHEDRINE 5 MG/ML INJ
INTRAVENOUS | Status: AC
  Filled 2020-05-26: qty 10

## 2020-05-26 MED ORDER — PROPOFOL 10 MG/ML IV BOLUS
INTRAVENOUS | Status: AC
  Filled 2020-05-26: qty 20

## 2020-05-26 MED ORDER — LIDOCAINE 2% (20 MG/ML) 5 ML SYRINGE
INTRAMUSCULAR | Status: AC
  Filled 2020-05-26: qty 5

## 2020-05-26 MED ORDER — OXYCODONE HCL 5 MG PO TABS
ORAL_TABLET | ORAL | Status: AC
  Filled 2020-05-26: qty 1

## 2020-05-26 MED ORDER — OXYCODONE HCL 5 MG PO TABS: 5.0000 mg | ORAL_TABLET | Freq: Four times a day (QID) | ORAL | 0 refills | Status: DC | PRN

## 2020-05-26 MED ORDER — SODIUM CHLORIDE 0.9 % IR SOLN
Status: DC | PRN
  Administered 2020-05-26: 1000 mL

## 2020-05-26 MED ORDER — PHENYLEPHRINE 40 MCG/ML (10ML) SYRINGE FOR IV PUSH (FOR BLOOD PRESSURE SUPPORT)
PREFILLED_SYRINGE | INTRAVENOUS | Status: AC
  Filled 2020-05-26: qty 10

## 2020-05-26 MED ORDER — ACETAMINOPHEN 160 MG/5ML PO SOLN: 325.0000 mg | ORAL | Status: DC | PRN

## 2020-05-26 MED ORDER — DEXAMETHASONE SODIUM PHOSPHATE 10 MG/ML IJ SOLN
INTRAMUSCULAR | Status: AC
  Filled 2020-05-26: qty 1

## 2020-05-26 MED ORDER — SUCCINYLCHOLINE CHLORIDE 200 MG/10ML IV SOSY
PREFILLED_SYRINGE | INTRAVENOUS | Status: AC
  Filled 2020-05-26: qty 10

## 2020-05-26 MED ORDER — MEPERIDINE HCL 25 MG/ML IJ SOLN: 6.2500 mg | INTRAMUSCULAR | Status: DC | PRN

## 2020-05-26 MED ORDER — ACETAMINOPHEN 325 MG PO TABS: 325.0000 mg | ORAL_TABLET | ORAL | Status: DC | PRN

## 2020-05-26 MED ORDER — CHLORHEXIDINE GLUCONATE 0.12 % MT SOLN: 15.0000 mL | Freq: Once | OROMUCOSAL | Status: AC

## 2020-05-26 MED ORDER — MIDAZOLAM HCL 5 MG/5ML IJ SOLN
INTRAMUSCULAR | Status: DC | PRN
  Administered 2020-05-26: 2 mg via INTRAVENOUS

## 2020-05-26 MED ORDER — PHENYLEPHRINE 40 MCG/ML (10ML) SYRINGE FOR IV PUSH (FOR BLOOD PRESSURE SUPPORT)
PREFILLED_SYRINGE | INTRAVENOUS | Status: DC | PRN
  Administered 2020-05-26: 120 ug via INTRAVENOUS
  Administered 2020-05-26: 160 ug via INTRAVENOUS

## 2020-05-26 MED ORDER — OXYCODONE HCL 5 MG PO TABS
5.0000 mg | ORAL_TABLET | Freq: Once | ORAL | Status: AC | PRN
  Administered 2020-05-26: 5 mg via ORAL

## 2020-05-26 MED ORDER — CHLORHEXIDINE GLUCONATE 0.12 % MT SOLN
OROMUCOSAL | Status: AC
  Administered 2020-05-26: 15 mL via OROMUCOSAL
  Filled 2020-05-26: qty 15

## 2020-05-26 MED ORDER — BUPIVACAINE-EPINEPHRINE 0.25% -1:200000 IJ SOLN
INTRAMUSCULAR | Status: DC | PRN
  Administered 2020-05-26: 16 mL

## 2020-05-26 MED ORDER — OXYCODONE HCL 5 MG/5ML PO SOLN: 5.0000 mg | Freq: Once | ORAL | Status: AC | PRN

## 2020-05-26 MED ORDER — ORAL CARE MOUTH RINSE: 15.0000 mL | Freq: Once | OROMUCOSAL | Status: AC

## 2020-05-26 MED ORDER — SUGAMMADEX SODIUM 200 MG/2ML IV SOLN
INTRAVENOUS | Status: DC | PRN
  Administered 2020-05-26: 200 mg via INTRAVENOUS

## 2020-05-26 MED ORDER — ROCURONIUM BROMIDE 10 MG/ML (PF) SYRINGE
PREFILLED_SYRINGE | INTRAVENOUS | Status: DC | PRN
  Administered 2020-05-26: 50 mg via INTRAVENOUS

## 2020-05-26 MED ORDER — ONDANSETRON HCL 4 MG/2ML IJ SOLN: 4.0000 mg | Freq: Once | INTRAMUSCULAR | Status: DC | PRN

## 2020-05-26 MED ORDER — BUPIVACAINE HCL (PF) 0.25 % IJ SOLN
INTRAMUSCULAR | Status: AC
  Filled 2020-05-26: qty 30

## 2020-05-26 MED ORDER — FENTANYL CITRATE (PF) 100 MCG/2ML IJ SOLN
INTRAMUSCULAR | Status: DC | PRN
  Administered 2020-05-26: 25 ug via INTRAVENOUS
  Administered 2020-05-26: 100 ug via INTRAVENOUS
  Administered 2020-05-26: 50 ug via INTRAVENOUS

## 2020-05-26 MED ORDER — FENTANYL CITRATE (PF) 100 MCG/2ML IJ SOLN
INTRAMUSCULAR | Status: AC
  Filled 2020-05-26: qty 2

## 2020-05-26 MED ORDER — FENTANYL CITRATE (PF) 100 MCG/2ML IJ SOLN
25.0000 ug | INTRAMUSCULAR | Status: DC | PRN
  Administered 2020-05-26: 50 ug via INTRAVENOUS

## 2020-05-26 MED ORDER — ROCURONIUM BROMIDE 10 MG/ML (PF) SYRINGE
PREFILLED_SYRINGE | INTRAVENOUS | Status: AC
  Filled 2020-05-26: qty 10

## 2020-05-26 MED ORDER — ONDANSETRON HCL 4 MG/2ML IJ SOLN
INTRAMUSCULAR | Status: DC | PRN
  Administered 2020-05-26 (×2): 4 mg via INTRAVENOUS

## 2020-05-26 MED ORDER — FENTANYL CITRATE (PF) 250 MCG/5ML IJ SOLN
INTRAMUSCULAR | Status: AC
  Filled 2020-05-26: qty 5

## 2020-05-26 SURGICAL SUPPLY — 47 items
ADH SKN CLS APL DERMABOND .7 (GAUZE/BANDAGES/DRESSINGS) ×1
APL PRP STRL LF DISP 70% ISPRP (MISCELLANEOUS) ×1
APPLIER CLIP 5 13 M/L LIGAMAX5 (MISCELLANEOUS) ×3
APR CLP MED LRG 5 ANG JAW (MISCELLANEOUS) ×1
BAG SPEC RTRVL LRG 6X4 10 (ENDOMECHANICALS) ×1
BLADE CLIPPER SURG (BLADE) IMPLANT
CANISTER SUCT 3000ML PPV (MISCELLANEOUS) ×3 IMPLANT
CHLORAPREP W/TINT 26 (MISCELLANEOUS) ×3 IMPLANT
CLIP APPLIE 5 13 M/L LIGAMAX5 (MISCELLANEOUS) ×1 IMPLANT
COVER SURGICAL LIGHT HANDLE (MISCELLANEOUS) ×3 IMPLANT
COVER WAND RF STERILE (DRAPES) ×3 IMPLANT
DERMABOND ADVANCED (GAUZE/BANDAGES/DRESSINGS) ×2
DERMABOND ADVANCED .7 DNX12 (GAUZE/BANDAGES/DRESSINGS) ×1 IMPLANT
ELECT REM PT RETURN 9FT ADLT (ELECTROSURGICAL) ×3
ELECTRODE REM PT RTRN 9FT ADLT (ELECTROSURGICAL) ×1 IMPLANT
GLOVE BIOGEL PI IND STRL 6 (GLOVE) ×1 IMPLANT
GLOVE BIOGEL PI INDICATOR 6 (GLOVE) ×2
GLOVE BIOGEL PI MICRO 5.5 (GLOVE) ×2
GLOVE BIOGEL PI MICRO STRL 5.5 (GLOVE) ×1 IMPLANT
GOWN STRL REUS W/ TWL LRG LVL3 (GOWN DISPOSABLE) ×3 IMPLANT
GOWN STRL REUS W/TWL LRG LVL3 (GOWN DISPOSABLE) ×9
KIT BASIN OR (CUSTOM PROCEDURE TRAY) ×3 IMPLANT
KIT TURNOVER KIT B (KITS) ×3 IMPLANT
L-HOOK LAP DISP 36CM (ELECTROSURGICAL) ×3
LHOOK LAP DISP 36CM (ELECTROSURGICAL) ×1 IMPLANT
NDL INSUFFLATION 14GA 120MM (NEEDLE) IMPLANT
NEEDLE INSUFFLATION 14GA 120MM (NEEDLE) IMPLANT
NS IRRIG 1000ML POUR BTL (IV SOLUTION) ×3 IMPLANT
PAD ARMBOARD 7.5X6 YLW CONV (MISCELLANEOUS) ×3 IMPLANT
PENCIL BUTTON HOLSTER BLD 10FT (ELECTRODE) ×3 IMPLANT
POUCH SPECIMEN RETRIEVAL 10MM (ENDOMECHANICALS) ×3 IMPLANT
SCISSORS LAP 5X35 DISP (ENDOMECHANICALS) ×3 IMPLANT
SET IRRIG TUBING LAPAROSCOPIC (IRRIGATION / IRRIGATOR) ×3 IMPLANT
SET TUBE SMOKE EVAC HIGH FLOW (TUBING) ×3 IMPLANT
SLEEVE ENDOPATH XCEL 5M (ENDOMECHANICALS) ×6 IMPLANT
SPECIMEN JAR SMALL (MISCELLANEOUS) ×3 IMPLANT
SUT MNCRL AB 4-0 PS2 18 (SUTURE) ×5 IMPLANT
SUT VIC AB 3-0 SH 27 (SUTURE)
SUT VIC AB 3-0 SH 27XBRD (SUTURE) IMPLANT
SUT VICRYL 0 UR6 27IN ABS (SUTURE) ×3 IMPLANT
TOWEL GREEN STERILE (TOWEL DISPOSABLE) ×3 IMPLANT
TOWEL GREEN STERILE FF (TOWEL DISPOSABLE) ×3 IMPLANT
TRAY LAPAROSCOPIC MC (CUSTOM PROCEDURE TRAY) ×3 IMPLANT
TROCAR XCEL 12X100 BLDLESS (ENDOMECHANICALS) IMPLANT
TROCAR XCEL BLUNT TIP 100MML (ENDOMECHANICALS) ×3 IMPLANT
TROCAR XCEL NON-BLD 5MMX100MML (ENDOMECHANICALS) ×3 IMPLANT
WATER STERILE IRR 1000ML POUR (IV SOLUTION) ×3 IMPLANT

## 2020-05-26 NOTE — Anesthesia Procedure Notes (Addendum)
Procedure Name: Intubation Date/Time: 05/26/2020 7:44 AM Performed by: Lowella Dell, CRNA Pre-anesthesia Checklist: Patient identified, Emergency Drugs available, Suction available and Patient being monitored Patient Re-evaluated:Patient Re-evaluated prior to induction Oxygen Delivery Method: Circle System Utilized Preoxygenation: Pre-oxygenation with 100% oxygen Induction Type: IV induction Ventilation: Mask ventilation without difficulty Laryngoscope Size: Mac and 3 Grade View: Grade I Tube type: Oral Tube size: 7.0 mm Number of attempts: 1 Airway Equipment and Method: Stylet and Oral airway Placement Confirmation: ETT inserted through vocal cords under direct vision,  positive ETCO2 and breath sounds checked- equal and bilateral Secured at: 22 (at the lip) cm Tube secured with: Tape Dental Injury: Teeth and Oropharynx as per pre-operative assessment  Comments: Performed by  Theron Arista, SRNA

## 2020-05-26 NOTE — Discharge Instructions (Signed)
SURGERY DISCHARGE INSTRUCTIONS  Activity . No heavy lifting greater than 10 pounds for 4 weeks after surgery. [place] to shower, but do not bathe or submerge incisions underwater. . Do not drive while taking narcotic pain medication.  Wound Care . Your incisions are covered with skin glue called Dermabond. This will peel off on its own over time. . You may shower and allow warm soapy water to run over your incisions. Gently pat dry. . Do not submerge your incision underwater. . Monitor your incision for any new redness, tenderness, or drainage.  When to Call us: Marland Kitchen Fever greater than 100.5 . New redness, drainage, or swelling at incision site . Severe pain, nausea, or vomiting . Jaundice (yellowing of the whites of the eyes or skin)  Follow-up You have an appointment scheduled with Dr. Freida Busman on 06/15/20 at 3:30pm. This will be at the [facility] Surgery office at [address], [address], [place], [place]. Please arrive at least 15 minutes prior to your scheduled appointment time.  For questions or concerns, please call the office at [phone].

## 2020-05-26 NOTE — Transfer of Care (Signed)
Immediate Anesthesia Transfer of Care Note  Patient: Rhonda Tran  Procedure(s) Performed: LAPAROSCOPIC CHOLECYSTECTOMY (N/A )  Patient Location: PACU  Anesthesia Type:General  Level of Consciousness: awake, alert , oriented and patient cooperative  Airway & Oxygen Therapy: Patient Spontanous Breathing and Patient connected to face mask oxygen  Post-op Assessment: Report given to RN and Post -op Vital signs reviewed and stable  Post vital signs: Reviewed and stable  Last Vitals:  Vitals Value Taken Time  BP 119/77 05/26/20 0849  Temp    Pulse 99 05/26/20 0849  Resp 31 05/26/20 0849  SpO2 99 % 05/26/20 0849  Vitals shown include unvalidated device data.  Last Pain:  Vitals:   05/26/20 0643  TempSrc:   PainSc: 0-No pain         Complications: No complications documented.

## 2020-05-26 NOTE — Op Note (Signed)
Date: 05/26/20  Patient: Rhonda Tran MRN: [id number]  Preoperative Diagnosis: Symptomatic cholelithiasis Postoperative Diagnosis: Same  Procedure: Laparoscopic cholecystectomy  Surgeon: Sophronia Simas, MD  EBL: Minimal  Anesthesia: General  Specimens: Gallbladder  Indications: [name] is a 34 yo female who presented with intermittent RUQ abdominal pain. A RUQ US showed gallstones without evidence of acute cholecystitis. After a discussion of the risks and benefits of surgery, she agreed to proceed with cholecystectomy.  Findings: Cholelithiasis with chronic cholecystitis. Hepatic steatosis.  Procedure details: Informed consent was obtained in the preoperative area prior to the procedure. The patient was brought to the operating room and placed on the table in the supine position. General anesthesia was induced and appropriate lines and drains were placed for intraoperative monitoring. Perioperative antibiotics were administered per SCIP guidelines. The abdomen was prepped and draped in the usual sterile fashion. A pre-procedure timeout was taken verifying patient identity, surgical site and procedure to be performed.  A small infraumbilical skin incision was made, the subcutaneous tissue was divided with cautery, and the umbilical stalk was grasped and elevated. The fascia was incised and the peritoneal cavity was directly visualized. A 1mm Hassan trocar was placed. The peritoneal cavity was inspected with no evidence of visceral or vascular injury. Three 35mm ports were placed in the right subcostal margin, all under direct visualization. The liver was steatotic in appearance but not cirrhotic. The fundus of the gallbladder was grasped and retracted cephalad. The infundibulum was retracted laterally. Multiple stones were palpated in the infundibulum. The cystic triangle was dissected out using cautery and blunt dissection, and the critical view of safety was obtained. The cystic  duct and cystic artery were clipped and ligated. The gallbladder was taken off the liver using cautery. The specimen was placed in an endocatch bag and removed, and sent for routine pathology. The surgical site was irrigated with saline until the effluent was clear. Hemostasis was achieved in the gallbladder fossa using cautery. The cystic duct and artery stumps were visually inspected and there was no evidence of bile leak or bleeding. There was a small liver laceration to the right of the umbilical fissure, secondary to retraction, and this was hemostatic. The ports were removed under direct visualization and the abdomen was desufflated. The umbilical port site fascia was closed with a 0 vicryl suture. The skin at all port sites was closed with 4-0 monocryl subcuticular suture. Dermabond was applied.  All counts were correct x2 at the end of the procedure. The patient was extubated and taken to PACU in stable condition. The patient tolerated the procedure well with no apparent complications.  Sophronia Simas, MD 05/26/20 8:50 AM

## 2020-05-26 NOTE — H&P (Signed)
Rhonda Tran is an 34 y.o. female.   Chief Complaint: gallstones HPI: Ms. Pflieger is a 34 yo female who was referred with epigastric and RUQ pain and was found to have gallstones on RUQ ultrasound. LFTs were normal. Her symptoms were worse during her pregnancy. She presents today for cholecystectomy. No recent illnesses. COVID negative on 11/15.  Past Medical History:  Diagnosis Date  . First degree perineal laceration during delivery 01/12/2017  . Gestational HTN 2018   son delivered 01/12/2017  . Maternal anemia, with delivery 01/13/2017  . Migraine headache 06/14/2012  . 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 CARDIAC SURGERY      Family History  Problem Relation Age of Onset  . Cancer Maternal Grandmother   . Cancer Maternal Grandfather    Social History:  reports that she has never smoked. She has never used smokeless tobacco. She reports that she does not drink alcohol and does not use drugs.  Allergies: No Known Allergies  Medications Prior to Admission  Medication Sig Dispense Refill  . Prenatal Vit-Fe Fumarate-FA (PRENATAL MULTIVITAMIN) TABS tablet Take 1 tablet by mouth daily at 12 noon.      Results for orders placed or performed during the hospital encounter of 05/26/20 (from the past 48 hour(s))  Pregnancy, urine POC     Status: None   Collection Time: 05/26/20  6:52 AM  Result Value Ref Range   Preg Test, Ur NEGATIVE NEGATIVE    Comment:        THE SENSITIVITY OF THIS METHODOLOGY IS >24 mIU/mL    No results found.  Review of Systems  Constitutional: Negative for chills and fever.  Respiratory: Negative for shortness of breath and stridor.   Cardiovascular: Negative for chest pain.  Gastrointestinal: Positive for abdominal pain. Negative for nausea and vomiting.  Genitourinary: Negative for difficulty urinating.  Musculoskeletal: Negative for gait  problem and joint swelling.  Skin: Negative for color change and pallor.  Allergic/Immunologic: Negative for immunocompromised state.  Neurological: Negative for seizures and speech difficulty.  Psychiatric/Behavioral: Negative for agitation and confusion.    Blood pressure 129/70, pulse 74, temperature 98.3 F (36.8 C), temperature source Oral, resp. rate 17, height 5\' 3"  (1.6 m), weight 83 kg, SpO2 100 %. Physical Exam Constitutional:      General: She is not in acute distress.    Appearance: Normal appearance.  HENT:     Head: Normocephalic and atraumatic.  Eyes:     General: No scleral icterus.    Conjunctiva/sclera: Conjunctivae normal.  Pulmonary:     Effort: Pulmonary effort is normal. No respiratory distress.  Abdominal:     General: There is no distension.     Palpations: Abdomen is soft.     Tenderness: There is no abdominal tenderness.  Musculoskeletal:        General: No swelling. Normal range of motion.     Cervical back: Normal range of motion. No rigidity.  Skin:    General: Skin is warm and dry.     Coloration: Skin is not jaundiced.  Neurological:     General: No focal deficit present.     Mental Status: She is alert and oriented to person, place, and time.  Psychiatric:        Mood and Affect: Mood normal.        Behavior: Behavior normal.  Thought Content: Thought content normal.      Assessment/Plan 34 yo female with symptomatic cholelithiasis. Proceed to OR for laparoscopic cholecystectomy. Plan for discharge from PACU. Informed consent obtained, risks and benefits discussed.  Fritzi Mandes, MD 05/26/2020, 7:08 AM

## 2020-05-27 ENCOUNTER — Encounter (HOSPITAL_COMMUNITY): Payer: Self-pay | Admitting: Surgery

## 2020-05-27 LAB — SURGICAL PATHOLOGY

## 2020-05-27 NOTE — Anesthesia Postprocedure Evaluation (Signed)
Anesthesia Post Note  Patient: Rhonda Tran  Procedure(s) Performed: LAPAROSCOPIC CHOLECYSTECTOMY (N/A )     Patient location during evaluation: PACU Anesthesia Type: General Level of consciousness: awake and alert Pain management: pain level controlled Vital Signs Assessment: post-procedure vital signs reviewed and stable Respiratory status: spontaneous breathing, nonlabored ventilation, respiratory function stable and patient connected to nasal cannula oxygen Cardiovascular status: blood pressure returned to baseline and stable Postop Assessment: no apparent nausea or vomiting Anesthetic complications: no   No complications documented.  Last Vitals:  Vitals:   05/26/20 0915 05/26/20 0918  BP:  123/76  Pulse: 83 88  Resp: 20 20  Temp:  36.5 C  SpO2: 98% 100%    Last Pain:  Vitals:   05/26/20 0918  TempSrc:   PainSc: 6                  [name]

## 2020-12-14 IMAGING — US US ABDOMEN LIMITED
1 series · 14 of 25 positions shown · non-contrast
Comparison: None.

CLINICAL DATA: Right upper quadrant pain.

EXAM:
ULTRASOUND ABDOMEN LIMITED RIGHT UPPER QUADRANT

[Series 1: us abdomen limited ruq (liver/gb) · 14 of 34 slices shown]
[im 1/34]
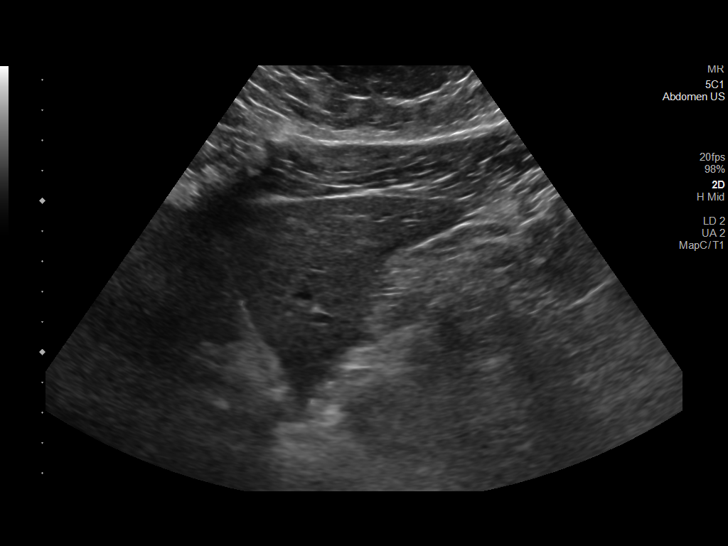
[im 3/34]
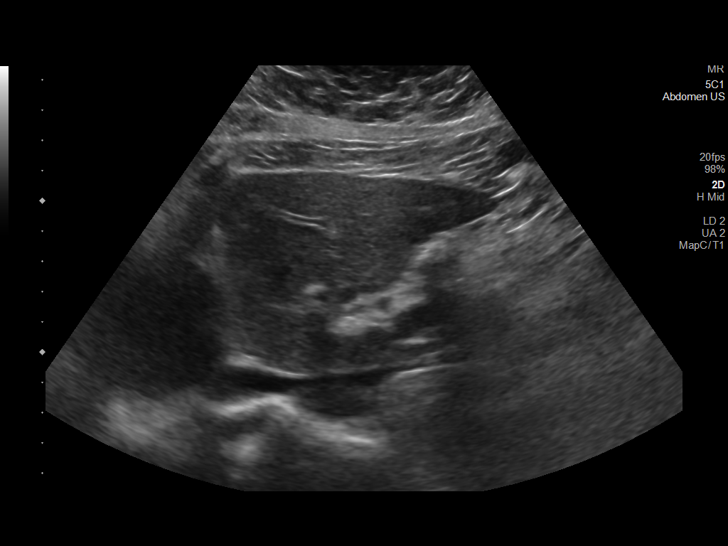
[im 6/34]
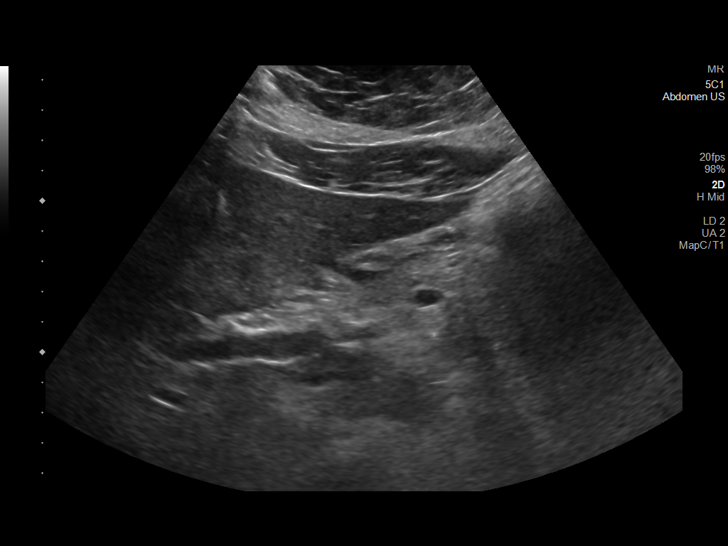
[im 9/34]
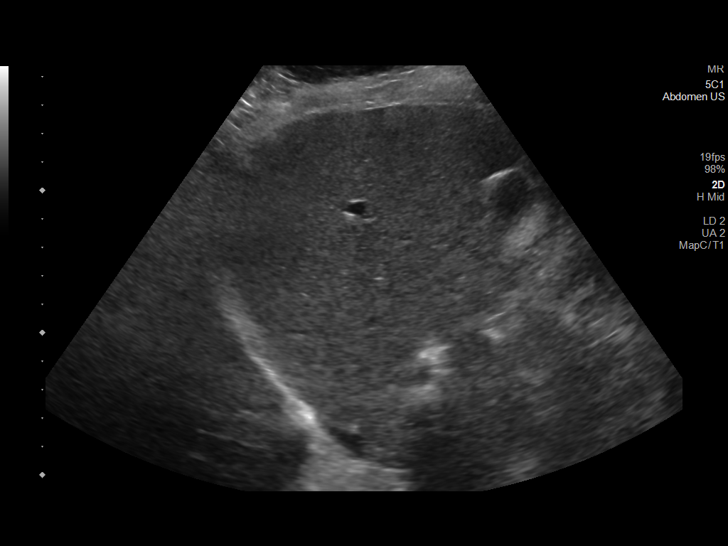
[im 12/34]
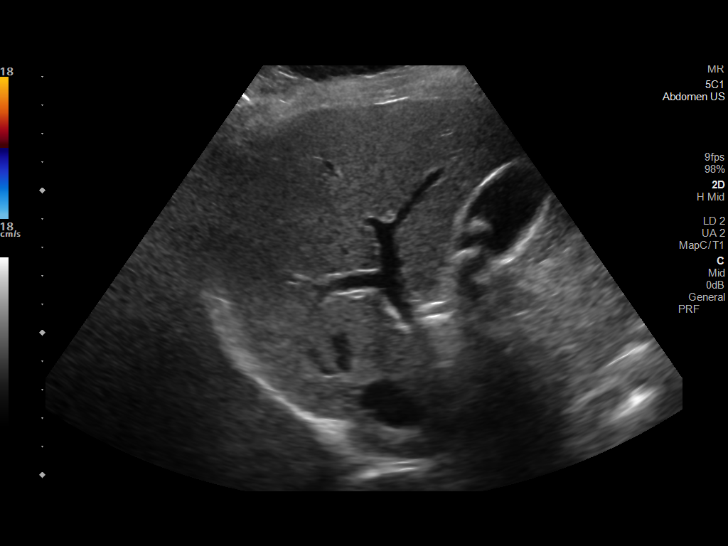
[im 13/34]
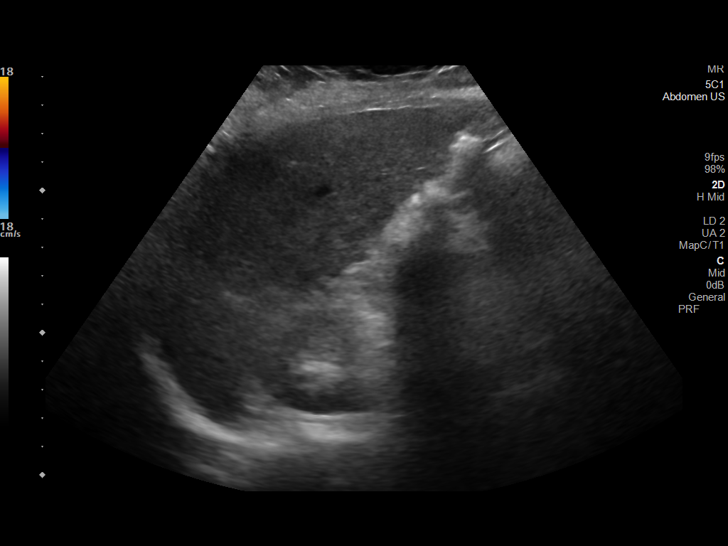
[im 16/34]
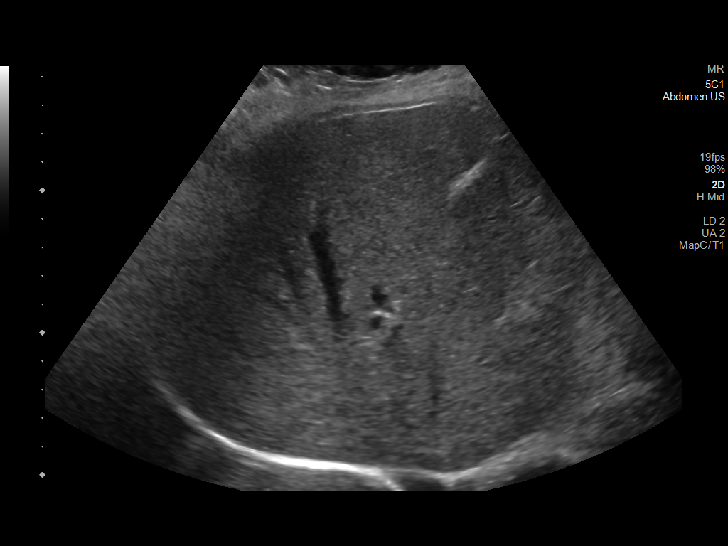
[im 18/34]
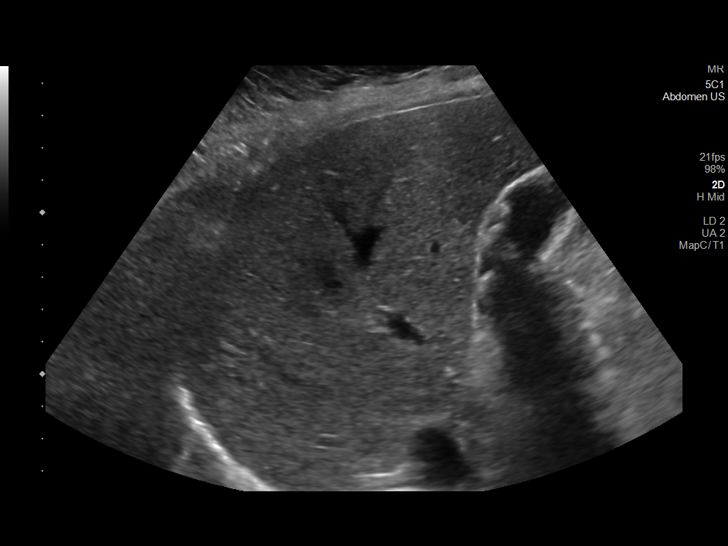
[im 21/34]
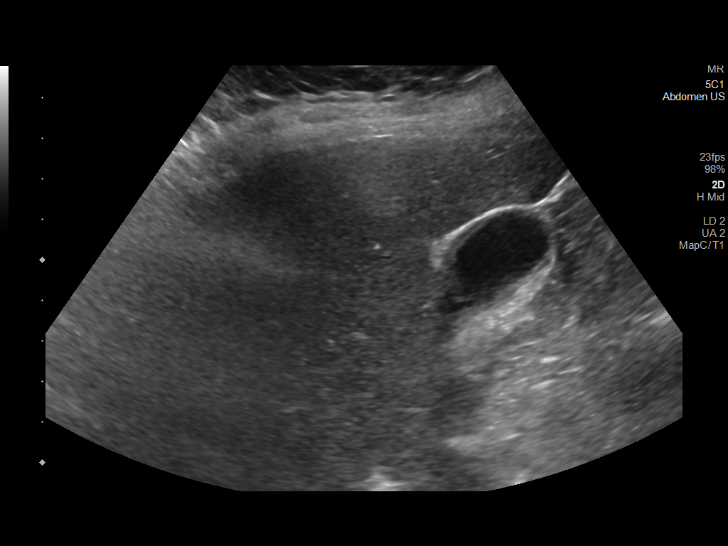
[im 23/34]
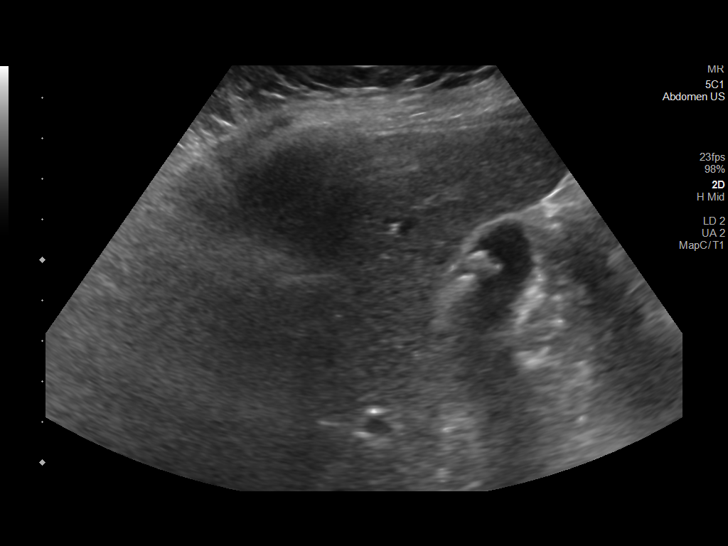
[im 25/34]
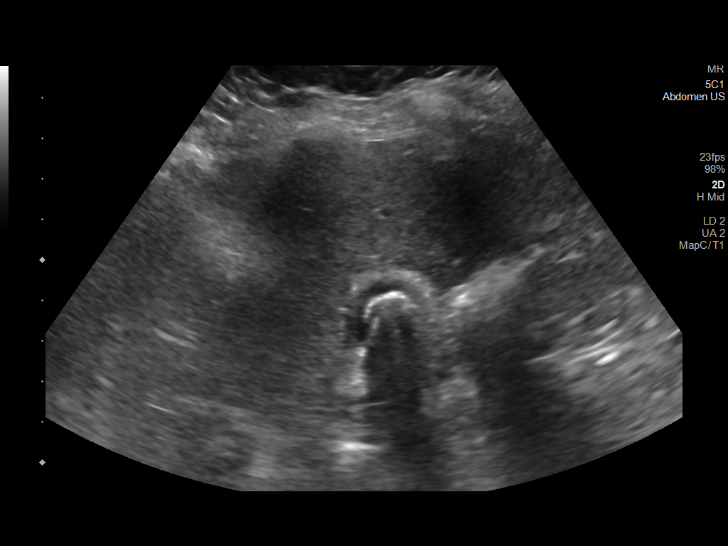
[im 28/34]
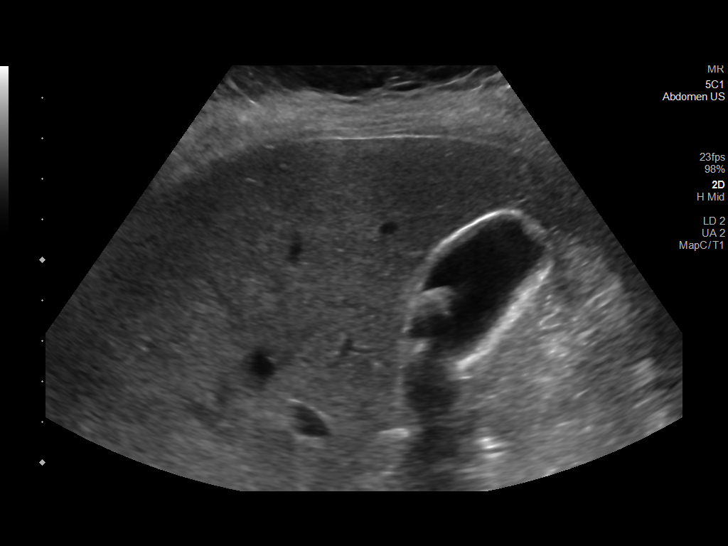
[im 31/34]
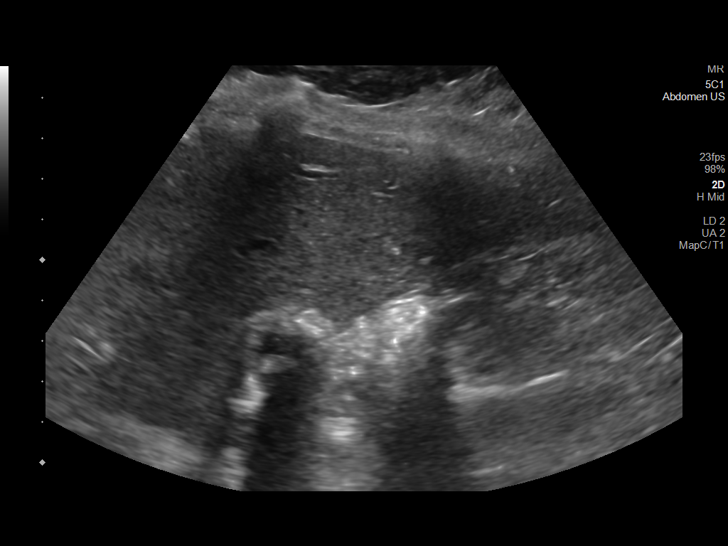
[im 34/34]
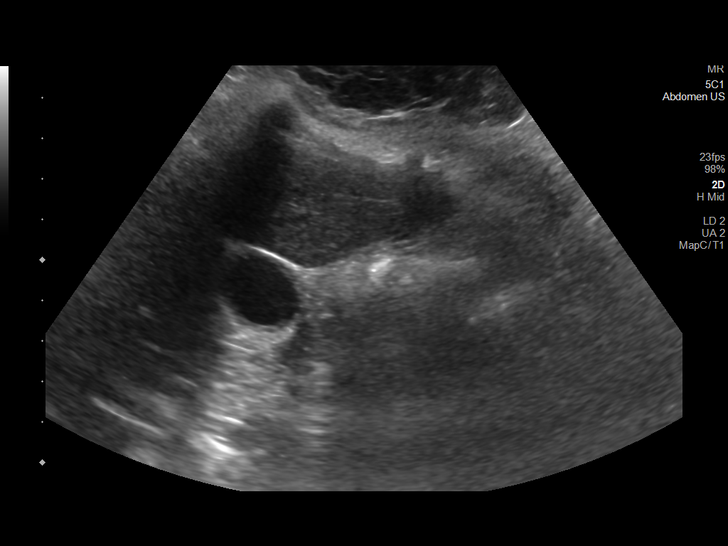

[14 of 25 positions shown; findings below may reference images not displayed]

FINDINGS: Gallbladder:

Multiple gallstones identified measuring up to 13 mm. No gallbladder
wall thickening or pericholecystic fluid. Sonographer reports no
sonographic Murphy sign.

Common bile duct:

Diameter: 3 mm

Liver:

No focal lesion identified. Within normal limits in parenchymal
echogenicity. Portal vein is patent on color Doppler imaging with
normal direction of blood flow towards the liver.

Other: None.
IMPRESSION: Uncomplicated cholelithiasis.

## 2021-05-24 LAB — OB RESULTS CONSOLE ANTIBODY SCREEN: Antibody Screen: NEGATIVE

## 2021-05-24 LAB — OB RESULTS CONSOLE GC/CHLAMYDIA
Chlamydia: NEGATIVE
Neisseria Gonorrhea: NEGATIVE

## 2021-05-24 LAB — OB RESULTS CONSOLE ABO/RH: RH Type: POSITIVE

## 2021-05-24 LAB — OB RESULTS CONSOLE RPR: RPR: NONREACTIVE

## 2021-05-24 LAB — OB RESULTS CONSOLE HIV ANTIBODY (ROUTINE TESTING): HIV: NONREACTIVE

## 2021-05-24 LAB — OB RESULTS CONSOLE HEPATITIS B SURFACE ANTIGEN: Hepatitis B Surface Ag: NEGATIVE

## 2021-05-24 LAB — OB RESULTS CONSOLE RUBELLA ANTIBODY, IGM: Rubella: IMMUNE

## 2021-05-24 LAB — HEPATITIS C ANTIBODY: HCV Ab: NEGATIVE

## 2021-07-09 NOTE — L&D Delivery Note (Signed)
Delivery Note At  2224 a viable and healthy female was delivered over intact perineum via svd (Presentation: cephalic;  ROA).  APGAR: 9, 9; weight  not yet done  Placenta status: delivered spontaneously,intact .  Cord: 3vv, with the following complications: none.  Anesthesia: epidural  Episiotomy:  none Lacerations:  small 1st degree Suture Repair: 3.0 vicryl Est. Blood Loss (mL):   Mom to postpartum.  Baby to Couplet care / Skin to Skin.  Vick Frees 12/09/2021, 10:39 PM

## 2021-10-04 ENCOUNTER — Encounter: Payer: BC Managed Care – PPO | Attending: Obstetrics and Gynecology | Admitting: Registered"

## 2021-10-04 DIAGNOSIS — O24419 Gestational diabetes mellitus in pregnancy, unspecified control: Secondary | ICD-10-CM | POA: Insufficient documentation

## 2021-10-05 ENCOUNTER — Encounter: Payer: Self-pay | Admitting: Registered"

## 2021-10-05 NOTE — Progress Notes (Signed)
Patient was seen on 10/04/21 for Gestational Diabetes self-management class at the [facility]. The following learning objectives were met by the patient during this course: ? ?States the definition of Gestational Diabetes ?States why dietary management is important in controlling blood glucose ?Describes the effects each nutrient has on blood glucose levels ?Demonstrates ability to create a balanced meal plan ?Demonstrates carbohydrate counting  ?States when to check blood glucose levels ?Demonstrates proper blood glucose monitoring techniques ?States the effect of stress and exercise on blood glucose levels ?States the importance of limiting caffeine and abstaining from alcohol and smoking ? ?Blood glucose monitor given: Patient has meter and is checking blood sugar prior to class  ? ?Patient instructed to monitor glucose levels: ?FBS: 60 - <95; 1 hour: <140; 2 hour: <120 ? ?Patient received handouts: ?Nutrition Diabetes and Pregnancy, including carb counting list ? ?Patient will be seen for follow-up as needed. ?

## 2021-11-23 LAB — OB RESULTS CONSOLE GBS: GBS: NEGATIVE

## 2021-12-05 ENCOUNTER — Telehealth (HOSPITAL_COMMUNITY): Payer: Self-pay | Admitting: *Deleted

## 2021-12-05 ENCOUNTER — Encounter (HOSPITAL_COMMUNITY): Payer: Self-pay | Admitting: *Deleted

## 2021-12-05 NOTE — Telephone Encounter (Signed)
Preadmission screen  

## 2021-12-07 ENCOUNTER — Other Ambulatory Visit: Payer: Self-pay | Admitting: Obstetrics and Gynecology

## 2021-12-09 ENCOUNTER — Inpatient Hospital Stay (HOSPITAL_COMMUNITY): Payer: BC Managed Care – PPO

## 2021-12-09 ENCOUNTER — Inpatient Hospital Stay (HOSPITAL_COMMUNITY): Payer: BC Managed Care – PPO | Admitting: Anesthesiology

## 2021-12-09 ENCOUNTER — Inpatient Hospital Stay (HOSPITAL_COMMUNITY)
Admission: AD | Admit: 2021-12-09 | Discharge: 2021-12-11 | DRG: 807 | Disposition: A | Discharge status: Home / Self Care | Payer: BC Managed Care – PPO | Attending: Obstetrics and Gynecology | Admitting: Obstetrics and Gynecology

## 2021-12-09 DIAGNOSIS — O99284 Endocrine, nutritional and metabolic diseases complicating childbirth: Secondary | ICD-10-CM | POA: Diagnosis present

## 2021-12-09 DIAGNOSIS — Z3490 Encounter for supervision of normal pregnancy, unspecified, unspecified trimester: Secondary | ICD-10-CM | POA: Diagnosis present

## 2021-12-09 DIAGNOSIS — O24420 Gestational diabetes mellitus in childbirth, diet controlled: Principal | ICD-10-CM | POA: Diagnosis present

## 2021-12-09 DIAGNOSIS — Z3A39 39 weeks gestation of pregnancy: Secondary | ICD-10-CM

## 2021-12-09 DIAGNOSIS — O9902 Anemia complicating childbirth: Secondary | ICD-10-CM | POA: Diagnosis present

## 2021-12-09 DIAGNOSIS — D509 Iron deficiency anemia, unspecified: Secondary | ICD-10-CM | POA: Diagnosis present

## 2021-12-09 DIAGNOSIS — E039 Hypothyroidism, unspecified: Secondary | ICD-10-CM | POA: Diagnosis present

## 2021-12-09 DIAGNOSIS — O26893 Other specified pregnancy related conditions, third trimester: Secondary | ICD-10-CM | POA: Diagnosis present

## 2021-12-09 DIAGNOSIS — Z8673 Personal history of transient ischemic attack (TIA), and cerebral infarction without residual deficits: Secondary | ICD-10-CM

## 2021-12-09 LAB — GLUCOSE, CAPILLARY
Glucose-Capillary: 86 mg/dL (ref 70–99)
Glucose-Capillary: 78 mg/dL (ref 70–99)

## 2021-12-09 LAB — CBC
HCT: 34.6 % — ABNORMAL LOW (ref 36.0–46.0)
Hemoglobin: 12 g/dL (ref 12.0–15.0)
MCH: 29.3 pg (ref 26.0–34.0)
MCHC: 34.7 g/dL (ref 30.0–36.0)
MCV: 84.4 fL (ref 80.0–100.0)
Platelets: 256 10*3/uL (ref 150–400)
RBC: 4.1 MIL/uL (ref 3.87–5.11)
RDW: 13.6 % (ref 11.5–15.5)
WBC: 7.2 10*3/uL (ref 4.0–10.5)
nRBC: 0 % (ref 0.0–0.2)

## 2021-12-09 LAB — TYPE AND SCREEN
ABO/RH(D): A POS
Antibody Screen: NEGATIVE

## 2021-12-09 MED ORDER — LACTATED RINGERS IV SOLN: 500.0000 mL | Freq: Once | INTRAVENOUS | Status: DC

## 2021-12-09 MED ORDER — DIPHENHYDRAMINE HCL 50 MG/ML IJ SOLN: 12.5000 mg | INTRAMUSCULAR | Status: DC | PRN

## 2021-12-09 MED ORDER — OXYTOCIN-SODIUM CHLORIDE 30-0.9 UT/500ML-% IV SOLN: 2.5000 [IU]/h | INTRAVENOUS | Status: DC

## 2021-12-09 MED ORDER — EPHEDRINE 5 MG/ML INJ: 10.0000 mg | INTRAVENOUS | Status: DC | PRN

## 2021-12-09 MED ORDER — FENTANYL-BUPIVACAINE-NACL 0.5-0.125-0.9 MG/250ML-% EP SOLN
12.0000 mL/h | EPIDURAL | Status: DC | PRN
  Filled 2021-12-09: qty 250

## 2021-12-09 MED ORDER — ACETAMINOPHEN 325 MG PO TABS: 650.0000 mg | ORAL_TABLET | ORAL | Status: DC | PRN

## 2021-12-09 MED ORDER — ONDANSETRON HCL 4 MG/2ML IJ SOLN: 4.0000 mg | Freq: Four times a day (QID) | INTRAMUSCULAR | Status: DC | PRN

## 2021-12-09 MED ORDER — OXYTOCIN BOLUS FROM INFUSION
333.0000 mL | Freq: Once | INTRAVENOUS | Status: AC
  Administered 2021-12-09: 333 mL via INTRAVENOUS

## 2021-12-09 MED ORDER — PHENYLEPHRINE 80 MCG/ML (10ML) SYRINGE FOR IV PUSH (FOR BLOOD PRESSURE SUPPORT): 80.0000 ug | PREFILLED_SYRINGE | INTRAVENOUS | Status: DC | PRN

## 2021-12-09 MED ORDER — OXYTOCIN-SODIUM CHLORIDE 30-0.9 UT/500ML-% IV SOLN
1.0000 m[IU]/min | INTRAVENOUS | Status: DC
  Administered 2021-12-09: 2 m[IU]/min via INTRAVENOUS
  Filled 2021-12-09: qty 500

## 2021-12-09 MED ORDER — TERBUTALINE SULFATE 1 MG/ML IJ SOLN: 0.2500 mg | Freq: Once | INTRAMUSCULAR | Status: DC | PRN

## 2021-12-09 MED ORDER — LACTATED RINGERS IV SOLN: 500.0000 mL | INTRAVENOUS | Status: DC | PRN

## 2021-12-09 MED ORDER — LACTATED RINGERS IV SOLN: INTRAVENOUS | Status: DC

## 2021-12-09 MED ORDER — LIDOCAINE HCL (PF) 1 % IJ SOLN: 30.0000 mL | INTRAMUSCULAR | Status: DC | PRN

## 2021-12-09 MED ORDER — SOD CITRATE-CITRIC ACID 500-334 MG/5ML PO SOLN: 30.0000 mL | ORAL | Status: DC | PRN

## 2021-12-09 MED ORDER — FENTANYL CITRATE (PF) 100 MCG/2ML IJ SOLN
100.0000 ug | INTRAMUSCULAR | Status: AC | PRN
  Administered 2021-12-09 (×2): 100 ug via INTRAVENOUS
  Filled 2021-12-09 (×2): qty 2

## 2021-12-09 NOTE — Progress Notes (Signed)
   Comfortable, did have iv med earlier for pain relief Balloon catheter just out  Patient Vitals for the past 24 hrs:  BP Temp Temp src Pulse Resp Height Weight  12/09/21 1930 108/76 98.3 F (36.8 C) Oral 91 18 -- --  12/09/21 1927 125/89 -- -- 81 -- -- --  12/09/21 1901 118/79 -- -- 88 18 -- --  12/09/21 1842 -- -- -- -- 18 -- --  12/09/21 1831 122/77 -- -- 71 20 -- --  12/09/21 1801 117/82 -- -- 76 18 -- --  12/09/21 1701 115/78 -- -- 71 20 -- --  12/09/21 1631 121/83 -- -- 70 18 -- --  12/09/21 1601 109/82 -- -- 99 18 -- --  12/09/21 1531 129/89 -- -- 82 20 -- --  12/09/21 1501 114/77 98.3 F (36.8 C) Axillary 80 18 -- --  12/09/21 1431 130/85 -- -- 88 -- -- --  12/09/21 1422 134/88 -- -- 89 18 -- --  12/09/21 1420 -- -- -- -- 20 -- --  12/09/21 1339 118/80 -- -- 80 18 -- --  12/09/21 1255 (!) 118/91 98.3 F (36.8 C) Oral 93 18 -- --  12/09/21 1253 -- -- -- -- -- 5\' 3"  (1.6 m) 96.2 kg   A&ox3 Nml respirations Abd: soft,nt,nd, gravid Cx: 5/60/-3, arom with copious fluid, clear LE: no edema, nt bilat  FHT: 120s, nml variability, +accels, no decels TOCO: q 1-2 min  A/P: iup at 39wga IOL - continue pitocin, plan svd Fetal status reassuring Hypothyroid - stable Gbs neg Gdma1 - bs wnl; begin q 2 active labor Bmi >30

## 2021-12-09 NOTE — Anesthesia Preprocedure Evaluation (Signed)
Anesthesia Evaluation  Patient identified by MRN, date of birth, ID band Patient awake    Reviewed: Allergy & Precautions, NPO status , Patient's Chart, lab work & pertinent test results  Airway Mallampati: II  TM Distance: >3 FB Neck ROM: Full    Dental no notable dental hx. (+) Teeth Intact, Dental Advisory Given   Pulmonary    Pulmonary exam normal breath sounds clear to auscultation       Cardiovascular hypertension, Normal cardiovascular exam Rhythm:Regular Rate:Normal     Neuro/Psych  Headaches, CVA, Residual Symptoms    GI/Hepatic negative GI ROS, Neg liver ROS,   Endo/Other  diabetes, Gestational  Renal/GU      Musculoskeletal negative musculoskeletal ROS (+)   Abdominal (+) + obese,   Peds  Hematology Lab Results      Component                Value               Date                      WBC                      7.2                 12/09/2021                HGB                      12.0                12/09/2021                HCT                      34.6 (L)            12/09/2021                MCV                      84.4                12/09/2021                PLT                      256                 12/09/2021              Anesthesia Other Findings   Reproductive/Obstetrics (+) Pregnancy                             Anesthesia Physical Anesthesia Plan  ASA: 3  Anesthesia Plan: Epidural   Post-op Pain Management:    Induction:   PONV Risk Score and Plan: Treatment may vary due to age or medical condition  Airway Management Planned:   Additional Equipment: None  Intra-op Plan:   Post-operative Plan:   Informed Consent: I have reviewed the patients History and Physical, chart, labs and discussed the procedure including the risks, benefits and alternatives for the proposed anesthesia with the patient or authorized representative who has indicated his/her  understanding and acceptance.       Plan Discussed with:  Anesthesia Plan Comments: (39 wk G2P1 w hx of CVA, Gdm w BMI 37 for LEA)        Anesthesia Quick Evaluation

## 2021-12-09 NOTE — Anesthesia Procedure Notes (Signed)

## 2021-12-09 NOTE — H&P (Signed)
Rhonda Tran is a 36 y.o. G2P1001 at [redacted]w[redacted]d gestation presents for IOL.  She denies ctx/lof/vb.  +FM  Antepartum course:  GDMA1 - controlled with diet  , aga 53% (6'10"); bmi >30; h/o septal occluder d/t patent foramen ovale - no anticoagulation, stable; migraine h/a, stable, no meds; h/o ghtn, baby asa this pregnancy; hypothyroid - stable on synthroid [facility] 10 wks.  See complete pre-natal records  History OB History     Gravida  2   Para  1   Term  1   Preterm      AB      Living  1      SAB      IAB      Ectopic      Multiple  0   Live Births  1          Past Medical History:  Diagnosis Date   First degree perineal laceration during delivery 01/12/2017   Gestational diabetes    Gestational HTN 2018   son delivered 01/12/2017   Maternal anemia, with delivery 01/13/2017   Migraine headache 06/14/2012   Stroke (HCC)    mini stroke; found septal defect; occluder inserted when pt was 36 y/o   Past Surgical History:  Procedure Laterality Date   CARDIAC SURGERY     CHOLECYSTECTOMY N/A 05/26/2020   Procedure: LAPAROSCOPIC CHOLECYSTECTOMY;  Surgeon: Fritzi Mandes, MD;  Location: [facility] OR;  Service: General;  Laterality: N/A;   Family History: family history includes Cancer in her maternal grandfather and maternal grandmother; Endometrial cancer in her mother. Social History:  reports that she has never smoked. She has never used smokeless tobacco. She reports that she does not drink alcohol and does not use drugs.  ROS: See above otherwise negative  Prenatal labs:  ABO, Rh: --/--/PENDING (06/03 1242) Antibody: PENDING (06/03 1242) Rubella: Immune (11/16 0000) RPR: Nonreactive (11/16 0000)  HBsAg: Negative (11/16 0000)  HIV:Non-reactive (11/16 0000)  GBS: Negative/-- (05/18 0000)  1 hr Glucola:  abnml, gdma Genetic screening: Normal Anatomy US: Normal  Physical Exam:     Height 5\' 3"  (1.6 m), weight 96.2 kg. A&O x  3 HEENT: Normal Lungs: CTAB CV: RRR Abdominal: Soft, Non-tender, Gravid, and Estimated fetal weight: 7  lbs  Lower Extremities: Non-edematous, Non-tender  Pelvic Exam:  1-2/50/-3; balloon catheter placed w/o difficulty; cephalic      Labs:  CBC:  Lab Results  Component Value Date   WBC 7.2 12/09/2021   RBC 4.10 12/09/2021   HGB 12.0 12/09/2021   HCT 34.6 (L) 12/09/2021   MCV 84.4 12/09/2021   MCH 29.3 12/09/2021   MCHC 34.7 12/09/2021   RDW 13.6 12/09/2021   PLT 256 12/09/2021     Prenatal Transfer Tool  Maternal Diabetes: Yes:  Diabetes Type:  Diet controlled Genetic Screening: Normal Maternal Ultrasounds/Referrals: Normal Fetal Ultrasounds or other Referrals:  None Maternal Substance Abuse:  No Significant Maternal Medications:  None Significant Maternal Lab Results: Group B Strep negative  FHT: 130s, nml variability, +accels, no decels Toco: irreg mild appearing ctx  Assessment/Plan:  36 y.o. G2P1001 at [redacted]w[redacted]d gestation   IOL - plan balloon catheter and piticin, plan svd Gdma1 - diet controlled; fsbs q 4 hr until active labor then q 2 hr; aga, 6'10" at 36wga H/o septal occluder  Hypothyroid - stable on synthroid Rh pos RI Gbs neg Bmi>30   [redacted]w[redacted]d 12/09/2021, 1:26 PM

## 2021-12-10 ENCOUNTER — Encounter (HOSPITAL_COMMUNITY): Payer: Self-pay | Admitting: Obstetrics and Gynecology

## 2021-12-10 LAB — CBC
HCT: 31.1 % — ABNORMAL LOW (ref 36.0–46.0)
Hemoglobin: 10.6 g/dL — ABNORMAL LOW (ref 12.0–15.0)
MCH: 29 pg (ref 26.0–34.0)
MCHC: 34.1 g/dL (ref 30.0–36.0)
MCV: 85.2 fL (ref 80.0–100.0)
Platelets: 200 10*3/uL (ref 150–400)
RBC: 3.65 MIL/uL — ABNORMAL LOW (ref 3.87–5.11)
RDW: 13.6 % (ref 11.5–15.5)
WBC: 11.6 10*3/uL — ABNORMAL HIGH (ref 4.0–10.5)
nRBC: 0 % (ref 0.0–0.2)

## 2021-12-10 LAB — RPR: RPR Ser Ql: NONREACTIVE

## 2021-12-10 MED ORDER — WITCH HAZEL-GLYCERIN EX PADS: 1.0000 "application " | MEDICATED_PAD | CUTANEOUS | Status: DC | PRN

## 2021-12-10 MED ORDER — TETANUS-DIPHTH-ACELL PERTUSSIS 5-2.5-18.5 LF-MCG/0.5 IM SUSY: 0.5000 mL | PREFILLED_SYRINGE | Freq: Once | INTRAMUSCULAR | Status: DC

## 2021-12-10 MED ORDER — ACETAMINOPHEN 325 MG PO TABS
650.0000 mg | ORAL_TABLET | ORAL | Status: DC | PRN
  Administered 2021-12-10 – 2021-12-11 (×3): 650 mg via ORAL
  Filled 2021-12-10 (×3): qty 2

## 2021-12-10 MED ORDER — ONDANSETRON HCL 4 MG PO TABS: 4.0000 mg | ORAL_TABLET | ORAL | Status: DC | PRN

## 2021-12-10 MED ORDER — SENNOSIDES-DOCUSATE SODIUM 8.6-50 MG PO TABS
2.0000 | ORAL_TABLET | Freq: Every day | ORAL | Status: DC
  Administered 2021-12-10 – 2021-12-11 (×2): 2 via ORAL
  Filled 2021-12-10 (×2): qty 2

## 2021-12-10 MED ORDER — DIPHENHYDRAMINE HCL 25 MG PO CAPS: 25.0000 mg | ORAL_CAPSULE | Freq: Four times a day (QID) | ORAL | Status: DC | PRN

## 2021-12-10 MED ORDER — SIMETHICONE 80 MG PO CHEW: 80.0000 mg | CHEWABLE_TABLET | ORAL | Status: DC | PRN

## 2021-12-10 MED ORDER — PRENATAL MULTIVITAMIN CH
1.0000 | ORAL_TABLET | Freq: Every day | ORAL | Status: DC
  Administered 2021-12-10 – 2021-12-11 (×2): 1 via ORAL
  Filled 2021-12-10 (×2): qty 1

## 2021-12-10 MED ORDER — COCONUT OIL OIL: 1.0000 "application " | TOPICAL_OIL | Status: DC | PRN

## 2021-12-10 MED ORDER — DIBUCAINE (PERIANAL) 1 % EX OINT: 1.0000 "application " | TOPICAL_OINTMENT | CUTANEOUS | Status: DC | PRN

## 2021-12-10 MED ORDER — IBUPROFEN 600 MG PO TABS
600.0000 mg | ORAL_TABLET | Freq: Four times a day (QID) | ORAL | Status: DC
  Administered 2021-12-10 – 2021-12-11 (×5): 600 mg via ORAL
  Filled 2021-12-10 (×6): qty 1

## 2021-12-10 MED ORDER — ONDANSETRON HCL 4 MG/2ML IJ SOLN: 4.0000 mg | INTRAMUSCULAR | Status: DC | PRN

## 2021-12-10 MED ORDER — BENZOCAINE-MENTHOL 20-0.5 % EX AERO: 1.0000 "application " | INHALATION_SPRAY | CUTANEOUS | Status: DC | PRN

## 2021-12-10 NOTE — Progress Notes (Signed)
No c/o; nml lochia, pain controlled, no prob voiding  Patient Vitals for the past 24 hrs:  BP Temp Temp src Pulse Resp SpO2 Height Weight  12/10/21 1141 132/80 97.6 F (36.4 C) Oral 64 18 100 % -- --  12/10/21 0831 125/77 97.9 F (36.6 C) Oral 68 18 97 % -- --  12/10/21 0630 121/86 98.5 F (36.9 C) -- 75 18 -- -- --  12/10/21 0209 109/80 98.1 F (36.7 C) -- 65 19 -- -- --  12/10/21 0057 126/83 98 F (36.7 C) -- 66 18 99 % -- --  12/09/21 2316 107/79 -- -- 95 -- -- -- --  12/09/21 2300 -- -- -- -- 18 -- -- --  12/09/21 2245 118/60 -- -- 94 18 -- -- --  12/09/21 2240 (!) 110/58 -- -- (!) 131 18 -- -- --  12/09/21 2235 121/76 -- -- 98 -- -- -- --  12/09/21 2220 115/76 -- -- (!) 126 18 -- -- --  12/09/21 2215 122/70 -- -- (!) 124 18 -- -- --  12/09/21 2210 111/63 -- -- (!) 119 -- -- -- --  12/09/21 2205 105/61 -- -- (!) 123 -- -- -- --  12/09/21 2159 111/71 -- -- (!) 117 -- -- -- --  12/09/21 2155 (!) 105/57 -- -- 98 18 -- -- --  12/09/21 2150 119/64 -- -- 99 18 -- -- --  12/09/21 2142 (!) 107/45 -- -- (!) 115 18 -- -- --  12/09/21 2135 121/75 -- -- (!) 110 18 -- -- --  12/09/21 2130 127/74 -- -- (!) 123 18 -- -- --  12/09/21 2030 120/80 -- -- 80 18 -- -- --  12/09/21 2003 133/76 -- -- 80 18 -- -- --  12/09/21 2002 (!) 132/103 -- -- (!) 103 -- -- -- --  12/09/21 1930 108/76 98.3 F (36.8 C) Oral 91 18 -- -- --  12/09/21 1927 125/89 -- -- 81 -- -- -- --  12/09/21 1901 118/79 -- -- 88 18 -- -- --  12/09/21 1842 -- -- -- -- 18 -- -- --  12/09/21 1831 122/77 -- -- 71 20 -- -- --  12/09/21 1801 117/82 -- -- 76 18 -- -- --  12/09/21 1701 115/78 -- -- 71 20 -- -- --  12/09/21 1631 121/83 -- -- 70 18 -- -- --  12/09/21 1601 109/82 -- -- 99 18 -- -- --  12/09/21 1531 129/89 -- -- 82 20 -- -- --  12/09/21 1501 114/77 98.3 F (36.8 C) Axillary 80 18 -- -- --  12/09/21 1431 130/85 -- -- 88 -- -- -- --  12/09/21 1422 134/88 -- -- 89 18 -- -- --  12/09/21 1420 -- -- -- -- 20 -- -- --   12/09/21 1339 118/80 -- -- 80 18 -- -- --  12/09/21 1255 (!) 118/91 98.3 F (36.8 C) Oral 93 18 -- -- --  12/09/21 1253 -- -- -- -- -- -- 5\' 3"  (1.6 m) 96.2 kg   A&ox3 Nml respirations Abd: soft, nt,nd, fundus firm and below umb LE: no edema,nt bilat     Latest Ref Rng & Units 12/10/2021    5:28 AM 12/09/2021    1:00 PM 05/26/2020    7:12 AM  CBC  WBC 4.0 - 10.5 K/uL 11.6   7.2   7.0    Hemoglobin 12.0 - 15.0 g/dL 10.6   12.0   14.4    Hematocrit 36.0 - 46.0 % 31.1   34.6  44.4    Platelets 150 - 400 K/uL 200   256   361       A/P: ppd1 s/p svd Doing well, contin care Rh pos RI GDMA1 - plan 2 hr pp and fasting in am, can d/c if wnl Hypothyroid IDA - asymptomatic, iron q day

## 2021-12-10 NOTE — Anesthesia Postprocedure Evaluation (Signed)
Anesthesia Post Note  Patient: Rhonda Tran  Procedure(s) Performed: AN AD HOC LABOR EPIDURAL     Patient location during evaluation: Mother Baby Anesthesia Type: Epidural Level of consciousness: awake and alert Pain management: pain level controlled Vital Signs Assessment: post-procedure vital signs reviewed and stable Respiratory status: spontaneous breathing, nonlabored ventilation and respiratory function stable Cardiovascular status: stable Postop Assessment: no headache, no backache, epidural receding, no apparent nausea or vomiting, patient able to bend at knees, adequate PO intake and able to ambulate Anesthetic complications: no   No notable events documented.  Last Vitals:  Vitals:   12/10/21 0630 12/10/21 0831  BP: 121/86 125/77  Pulse: 75 68  Resp: 18 18  Temp: 36.9 C 36.6 C  SpO2:  97%    Last Pain:  Vitals:   12/10/21 0831  TempSrc: Oral  PainSc: 2    Pain Goal:                Epidural/Spinal Function Cutaneous sensation: Normal sensation (12/10/21 0831), Patient able to flex knees: Yes (12/10/21 0831), Patient able to lift hips off bed: Yes (12/10/21 0831), Back pain beyond tenderness at insertion site: No (12/10/21 0831), Progressively worsening motor and/or sensory loss: No (12/10/21 0831), Bowel and/or bladder incontinence post epidural: No (12/10/21 0831)  France Ravens Hristova

## 2021-12-11 NOTE — Discharge Summary (Signed)
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 yo G2P2002 at [redacted]w[redacted]d was admitted to the hospital 12/09/2021 for induction of labor.  Indication for induction: Favorable cervix at term.  Patient had an uncomplicated labor course as follows: Membrane Rupture Time/Date: 7:54 PM ,12/09/2021   Delivery Method:Vaginal, Spontaneous  Episiotomy: None  Lacerations:  1st degree  Details of delivery can be found in separate delivery note.  Patient had a routine postpartum course. Patient is discharged home 12/11/21.  Newborn Data: Birth date:12/09/2021  Birth time:10:24 PM  Gender:Female  Living status:Living  Apgars:9 ,9  Weight:3240 g   Physical exam  Vitals:   12/10/21 0831 12/10/21 1141 12/10/21 2018 12/11/21 0510  BP: 125/77 132/80 114/76 120/74  Pulse: 68 64 64 76  Resp: 18 18    Temp: 97.9 F (36.6 C) 97.6 F (36.4  C) 98.4 F (36.9 C)   TempSrc: Oral Oral    SpO2: 97% 100%    Weight:      Height:       General: alert, cooperative, and no distress Lochia: appropriate Uterine Fundus: firm Incision: N/A Perineum: repair intact DVT Evaluation: No evidence of DVT seen on physical exam.  Labs: Lab Results  Component Value Date   WBC 11.6 (H) 12/10/2021   HGB 10.6 (L) 12/10/2021   HCT 31.1 (L) 12/10/2021   MCV 85.2 12/10/2021   PLT 200 12/10/2021      12/10/2021    8:17 AM 01/13/2017    2:00 PM  Edinburgh Postnatal Depression Scale Screening Tool  I have been able to laugh and see the funny side of things. 0 0  I have looked forward with enjoyment to things. 0 0  I have blamed myself unnecessarily when things went wrong. 0 0  I have been anxious or worried for no good reason. 0 0  I have felt scared or panicky for no good reason. 0 0  Things have been getting on top of me. 0 0  I have been so unhappy that I have had difficulty sleeping. 0 0  I have felt sad or miserable. 0 0  I have been so unhappy that I have been crying. 0 0  The thought of  harming myself has occurred to me. 0 0  Edinburgh Postnatal Depression Scale Total 0 0   Discharge instructions:  per After Visit Summary  After Visit Meds:  Allergies as of 12/11/2021   No Known Allergies      Medication List     STOP taking these medications    oxyCODONE 5 MG immediate release tablet Commonly known as: Oxy IR/ROXICODONE       TAKE these medications    prenatal multivitamin Tabs tablet Take 1 tablet by mouth daily at 12 noon.       Activity: Advance as tolerated. Pelvic rest for 6 weeks.   Newborn Data: Live born female  Birth Weight: 7 lb 2.3 oz (3240 g) APGAR: 64, 9  Newborn Delivery   Birth date/time: 12/09/2021 22:24:00 Delivery type: Vaginal, Spontaneous     Named Jaci Baby Feeding: Bottle Disposition:home with mother  Delivery Report:  Review the Delivery Report for details.    Follow up:   Follow-up Information     Murrell Redden Earlyne Iba, MD. Schedule an appointment as soon as possible for a visit in 6 week(s).   Specialty: Obstetrics and Gynecology Contact information: [place] [address] [place], CNM, MSN 12/11/2021, 12:04 PM

## 2021-12-16 ENCOUNTER — Inpatient Hospital Stay (HOSPITAL_COMMUNITY): Payer: BC Managed Care – PPO

## 2021-12-19 ENCOUNTER — Telehealth (HOSPITAL_COMMUNITY): Payer: Self-pay | Admitting: *Deleted

## 2021-12-19 NOTE — Telephone Encounter (Signed)
Attempted Hospital Discharge Follow-Up Call.  Left voice mail requesting that patient return RN's phone call.  

## 2022-07-06 ENCOUNTER — Ambulatory Visit: Payer: BC Managed Care – PPO | Admitting: Podiatry

## 2022-07-06 ENCOUNTER — Ambulatory Visit (INDEPENDENT_AMBULATORY_CARE_PROVIDER_SITE_OTHER): Payer: BC Managed Care – PPO

## 2022-07-06 DIAGNOSIS — M722 Plantar fascial fibromatosis: Secondary | ICD-10-CM

## 2022-07-06 MED ORDER — MELOXICAM 15 MG PO TABS: 15.0000 mg | ORAL_TABLET | Freq: Every day | ORAL | 0 refills | Status: AC

## 2022-07-06 NOTE — Progress Notes (Signed)
  Subjective:  Patient ID: Rhonda Tran, female    DOB: [birth year],  MRN: [id number]  Chief Complaint  Patient presents with   Foot Pain    Pain to bilateral feet entire bottom. Has not taken any medications.     36 y.o. female presents with the above complaint.  Patient presents with pain in the bottom of both heels and arches.  She says has been going on for weeks to months.  Having pain especially in the morning when she gets out of bed.  Does seem to get worse as the day goes on as well.  Has not tried much of anything in the way of treatment yet for this problem.   Review of Systems: Negative except as noted in the HPI. Denies N/V/F/Ch.   Objective:  There were no vitals filed for this visit. There is no height or weight on file to calculate BMI. Constitutional Well developed. Well nourished.  Vascular Dorsalis pedis pulses palpable bilaterally. Posterior tibial pulses palpable bilaterally. Capillary refill normal to all digits.  No cyanosis or clubbing noted. Pedal hair growth normal.  Neurologic Normal speech. Oriented to person, place, and time. Epicritic sensation to light touch grossly present bilaterally.  Dermatologic Nails well groomed and normal in appearance. No open wounds. No skin lesions.  Orthopedic: Normal joint ROM without pain or crepitus bilaterally. No visible deformities. Tender to palpation at the calcaneal tuber bilaterally. No pain with calcaneal squeeze bilaterally. Ankle ROM diminished range of motion bilaterally. Silfverskiold Test: positive bilaterally.   Radiographs: Taken and reviewed. No acute fractures or dislocations. No evidence of stress fracture.  Plantar heel spur absent. Posterior heel spur absent.   Assessment:   1. Plantar fasciitis, bilateral    Plan:  Patient was evaluated and treated and all questions answered.  Plantar Fasciitis, bilaterally - XR reviewed as above.  - Educated on icing and stretching. Instructions  given.  - Injection delivered to the plantar fascia as below. - DME: Power step orthotics dispensed to the patient she already has a night splint - Pharmacologic management: Meloxicam 15 mg take once daily in the morning for pain control. Educated on risks/benefits and proper taking of medication.  Procedure: Injection Tendon/Ligament Location: Bilateral plantar fascia at the glabrous junction; medial approach. Skin Prep: alcohol Injectate: 1 cc 0.5% marcaine plain, 1 cc kenalog 10. Disposition: Patient tolerated procedure well. Injection site dressed with a band-aid.  Return in about 4 weeks (around 08/03/2022) for f/u bilateral PF.

## 2022-07-06 NOTE — Patient Instructions (Signed)

## 2022-08-06 ENCOUNTER — Ambulatory Visit: Payer: BC Managed Care – PPO | Admitting: Podiatry

## 2022-08-06 DIAGNOSIS — M722 Plantar fascial fibromatosis: Secondary | ICD-10-CM | POA: Diagnosis not present

## 2022-08-06 MED ORDER — MELOXICAM 15 MG PO TABS: 15.0000 mg | ORAL_TABLET | Freq: Every day | ORAL | 0 refills | Status: AC

## 2022-08-06 NOTE — Progress Notes (Signed)
  Subjective:  Patient ID: Rhonda Tran, female    DOB: June 07, 1986,  MRN: [id number]  Chief Complaint  Patient presents with   Foot Pain    f/u bilateral PF, pt states pain is still there especially when she gets up in the morning     37 y.o. female presents for follow-up of bilateral plantar fasciitis.  She says that the pain is definitely better than it was previously.  She is still having some pain especially in the left heel when she gets out of bed in the morning or after walking long distances but not as bad as before.  Does wear the power step inserts in her [place]..   Review of Systems: Negative except as noted in the HPI. Denies N/V/F/Ch.   Objective:  There were no vitals filed for this visit. There is no height or weight on file to calculate BMI. Constitutional Well developed. Well nourished.  Vascular Dorsalis pedis pulses palpable bilaterally. Posterior tibial pulses palpable bilaterally. Capillary refill normal to all digits.  No cyanosis or clubbing noted. Pedal hair growth normal.  Neurologic Normal speech. Oriented to person, place, and time. Epicritic sensation to light touch grossly present bilaterally.  Dermatologic Nails well groomed and normal in appearance. No open wounds. No skin lesions.  Orthopedic: Normal joint ROM without pain or crepitus bilaterally. No visible deformities. Tender to palpation at the calcaneal tuber left no pain with calcaneal squeeze bilaterally. Ankle ROM diminished range of motion bilaterally. Silfverskiold Test: positive bilaterally.   Radiographs: Taken and reviewed. No acute fractures or dislocations. No evidence of stress fracture.  Plantar heel spur absent. Posterior heel spur absent.   Assessment:   1. Plantar fasciitis, bilateral     Plan:  Patient was evaluated and treated and all questions answered.  Plantar Fasciitis, bilaterally, left worse than right right is nearly resolved -Proved significantly after  steroid injection bilateral heel at last visit.  Patient is wearing power steps and they are improving as well.  Still doing stretching exercises. - XR reviewed as above.  - Educated on icing and stretching. Instructions given.  - Injection delivered to the plantar fascia as below. - DME: Continue power step orthotics and night splint for the left lower extremity - Pharmacologic management: Meloxicam 15 mg take once daily in the morning for pain control. Educated on risks/benefits and proper taking of medication.  Procedure: Injection Tendon/Ligament left Location: Left plantar fascia at the glabrous junction; medial approach. Skin Prep: alcohol Injectate: 1 cc 0.5% marcaine plain, 1 cc kenalog 10. Disposition: Patient tolerated procedure well. Injection site dressed with a band-aid.  Return if symptoms worsen or fail to improve. I

## 2022-09-26 ENCOUNTER — Other Ambulatory Visit (HOSPITAL_COMMUNITY): Payer: Self-pay

## 2022-09-26 MED ORDER — LISDEXAMFETAMINE DIMESYLATE 30 MG PO CAPS: 30.0000 mg | ORAL_CAPSULE | Freq: Every morning | ORAL | 0 refills | Status: AC

## 2022-09-26 MED ORDER — LISDEXAMFETAMINE DIMESYLATE 30 MG PO CAPS
30.0000 mg | ORAL_CAPSULE | Freq: Every morning | ORAL | 0 refills | Status: AC
Start: 2022-11-25 — End: ?

## 2022-09-26 MED ORDER — METHYLPHENIDATE HCL ER (OSM) 18 MG PO TBCR
18.0000 mg | EXTENDED_RELEASE_TABLET | Freq: Every day | ORAL | 0 refills | Status: AC
  Filled 2022-09-26: qty 30, 30d supply, fill #0

## 2022-09-26 MED ORDER — AZELASTINE HCL 0.1 % NA SOLN
2.0000 | Freq: Two times a day (BID) | NASAL | 0 refills | Status: AC
Start: 2022-09-26 — End: ?

## 2022-09-26 MED ORDER — LISDEXAMFETAMINE DIMESYLATE 30 MG PO CAPS
30.0000 mg | ORAL_CAPSULE | Freq: Every morning | ORAL | 0 refills | Status: AC
  Filled 2022-09-26 – 2023-02-25 (×2): qty 30, 30d supply, fill #0

## 2022-09-26 MED ORDER — AZELASTINE HCL 0.1 % NA SOLN
2.0000 | Freq: Two times a day (BID) | NASAL | 0 refills | Status: AC
  Filled 2022-09-26: qty 30, 50d supply, fill #0

## 2022-10-01 ENCOUNTER — Other Ambulatory Visit (HOSPITAL_COMMUNITY): Payer: Self-pay

## 2023-02-25 ENCOUNTER — Other Ambulatory Visit (HOSPITAL_COMMUNITY): Payer: Self-pay

## 2023-09-17 ENCOUNTER — Other Ambulatory Visit (HOSPITAL_COMMUNITY): Payer: Self-pay

## 2023-09-17 MED ORDER — LISDEXAMFETAMINE DIMESYLATE 40 MG PO CAPS
40.0000 mg | ORAL_CAPSULE | Freq: Every morning | ORAL | 0 refills | Status: AC
Start: 2023-09-17 — End: ?
  Filled 2023-09-17 – 2023-09-18 (×2): qty 30, 30d supply, fill #0

## 2023-09-18 ENCOUNTER — Other Ambulatory Visit (HOSPITAL_COMMUNITY): Payer: Self-pay

## 2023-10-07 ENCOUNTER — Other Ambulatory Visit (HOSPITAL_COMMUNITY): Payer: Self-pay

## 2023-10-07 MED ORDER — LISDEXAMFETAMINE DIMESYLATE 40 MG PO CAPS
40.0000 mg | ORAL_CAPSULE | Freq: Every morning | ORAL | 0 refills | Status: AC
  Filled 2023-12-19: qty 30, 30d supply, fill #0

## 2023-10-07 MED ORDER — LISDEXAMFETAMINE DIMESYLATE 40 MG PO CAPS
40.0000 mg | ORAL_CAPSULE | Freq: Every morning | ORAL | 0 refills | Status: AC
  Filled 2023-11-20: qty 30, 30d supply, fill #0

## 2023-10-07 MED ORDER — LISDEXAMFETAMINE DIMESYLATE 40 MG PO CAPS
40.0000 mg | ORAL_CAPSULE | Freq: Every morning | ORAL | 0 refills | Status: AC
Start: 2023-10-16 — End: ?
  Filled 2023-10-21: qty 30, 30d supply, fill #0

## 2023-10-18 LAB — HM COLONOSCOPY

## 2023-10-21 ENCOUNTER — Other Ambulatory Visit (HOSPITAL_COMMUNITY): Payer: Self-pay

## 2023-10-24 ENCOUNTER — Other Ambulatory Visit (HOSPITAL_COMMUNITY): Payer: Self-pay | Admitting: Gastroenterology

## 2023-10-24 DIAGNOSIS — R935 Abnormal findings on diagnostic imaging of other abdominal regions, including retroperitoneum: Secondary | ICD-10-CM

## 2023-10-24 DIAGNOSIS — R1011 Right upper quadrant pain: Secondary | ICD-10-CM

## 2023-10-30 ENCOUNTER — Ambulatory Visit (HOSPITAL_COMMUNITY)
Admission: RE | Admit: 2023-10-30 | Discharge: 2023-10-30 | Disposition: A | Discharge status: Home / Self Care | Source: Ambulatory Visit | Attending: Gastroenterology | Admitting: Gastroenterology

## 2023-10-30 DIAGNOSIS — R935 Abnormal findings on diagnostic imaging of other abdominal regions, including retroperitoneum: Secondary | ICD-10-CM | POA: Insufficient documentation

## 2023-10-30 DIAGNOSIS — R1011 Right upper quadrant pain: Secondary | ICD-10-CM | POA: Diagnosis present

## 2023-10-30 MED ORDER — IOHEXOL 300 MG/ML  SOLN
100.0000 mL | Freq: Once | INTRAMUSCULAR | Status: AC | PRN
  Administered 2023-10-30: 100 mL via INTRAVENOUS

## 2023-11-20 ENCOUNTER — Other Ambulatory Visit (HOSPITAL_COMMUNITY): Payer: Self-pay

## 2023-11-20 ENCOUNTER — Other Ambulatory Visit: Payer: Self-pay

## 2023-12-19 ENCOUNTER — Other Ambulatory Visit (HOSPITAL_COMMUNITY): Payer: Self-pay

## 2023-12-20 ENCOUNTER — Other Ambulatory Visit (HOSPITAL_COMMUNITY): Payer: Self-pay

## 2024-01-06 ENCOUNTER — Other Ambulatory Visit (HOSPITAL_COMMUNITY): Payer: Self-pay

## 2024-01-06 MED ORDER — LISDEXAMFETAMINE DIMESYLATE 40 MG PO CAPS
40.0000 mg | ORAL_CAPSULE | Freq: Every morning | ORAL | 0 refills | Status: DC
  Filled 2024-03-19: qty 30, 30d supply, fill #0

## 2024-01-06 MED ORDER — LISDEXAMFETAMINE DIMESYLATE 40 MG PO CAPS
40.0000 mg | ORAL_CAPSULE | Freq: Every morning | ORAL | 0 refills | Status: AC
  Filled 2024-01-20: qty 30, 30d supply, fill #0

## 2024-01-06 MED ORDER — LISDEXAMFETAMINE DIMESYLATE 40 MG PO CAPS
40.0000 mg | ORAL_CAPSULE | ORAL | 0 refills | Status: AC
  Filled 2024-02-17 – 2024-02-18 (×2): qty 30, 30d supply, fill #0

## 2024-01-20 ENCOUNTER — Other Ambulatory Visit (HOSPITAL_COMMUNITY): Payer: Self-pay

## 2024-01-24 ENCOUNTER — Encounter: Payer: Self-pay | Admitting: Advanced Practice Midwife

## 2024-02-17 ENCOUNTER — Other Ambulatory Visit (HOSPITAL_COMMUNITY): Payer: Self-pay

## 2024-02-18 ENCOUNTER — Other Ambulatory Visit (HOSPITAL_COMMUNITY): Payer: Self-pay

## 2024-03-19 ENCOUNTER — Other Ambulatory Visit (HOSPITAL_COMMUNITY): Payer: Self-pay

## 2024-04-06 ENCOUNTER — Other Ambulatory Visit (HOSPITAL_COMMUNITY): Payer: Self-pay

## 2024-04-06 MED ORDER — LISDEXAMFETAMINE DIMESYLATE 40 MG PO CAPS
40.0000 mg | ORAL_CAPSULE | Freq: Every morning | ORAL | 0 refills | Status: AC
  Filled 2024-04-06 – 2024-04-20 (×2): qty 30, 30d supply, fill #0

## 2024-04-06 MED ORDER — LISDEXAMFETAMINE DIMESYLATE 40 MG PO CAPS
40.0000 mg | ORAL_CAPSULE | Freq: Every morning | ORAL | 0 refills | Status: DC
  Filled 2024-06-17: qty 30, 30d supply, fill #0

## 2024-04-06 MED ORDER — LISDEXAMFETAMINE DIMESYLATE 40 MG PO CAPS
40.0000 mg | ORAL_CAPSULE | Freq: Every morning | ORAL | 0 refills | Status: AC
  Filled 2024-05-19: qty 30, 30d supply, fill #0

## 2024-04-20 ENCOUNTER — Other Ambulatory Visit (HOSPITAL_COMMUNITY): Payer: Self-pay

## 2024-05-19 ENCOUNTER — Other Ambulatory Visit (HOSPITAL_COMMUNITY): Payer: Self-pay

## 2024-05-25 ENCOUNTER — Encounter: Payer: Self-pay | Admitting: Internal Medicine

## 2024-06-15 ENCOUNTER — Encounter: Payer: Self-pay | Admitting: Pediatrics

## 2024-06-17 ENCOUNTER — Other Ambulatory Visit (HOSPITAL_COMMUNITY): Payer: Self-pay

## 2024-07-13 ENCOUNTER — Other Ambulatory Visit (HOSPITAL_COMMUNITY): Payer: Self-pay

## 2024-07-13 MED ORDER — LISDEXAMFETAMINE DIMESYLATE 40 MG PO CAPS: 40.0000 mg | ORAL_CAPSULE | Freq: Every morning | ORAL | 0 refills | Status: AC

## 2024-07-13 MED ORDER — LISDEXAMFETAMINE DIMESYLATE 40 MG PO CAPS
40.0000 mg | ORAL_CAPSULE | Freq: Every morning | ORAL | 0 refills | Status: AC
  Filled 2024-07-17: qty 30, 30d supply, fill #0
  Filled ????-??-??: fill #0

## 2024-07-16 ENCOUNTER — Other Ambulatory Visit (HOSPITAL_COMMUNITY): Payer: Self-pay

## 2024-07-17 ENCOUNTER — Other Ambulatory Visit (HOSPITAL_COMMUNITY): Payer: Self-pay

## 2024-08-05 ENCOUNTER — Ambulatory Visit: Admitting: Pediatrics
